# Patient Record
Sex: Female | Born: 1975 | Race: Black or African American | Hispanic: No | Marital: Single | Smoking: Former smoker
Health system: Southern US, Community
[De-identification: ages and names within clinical notes are randomized; demographics above are authoritative.]

## PROBLEM LIST (undated history)

## (undated) ENCOUNTER — Emergency Department (HOSPITAL_BASED_OUTPATIENT_CLINIC_OR_DEPARTMENT_OTHER): Payer: Self-pay

## (undated) DIAGNOSIS — D649 Anemia, unspecified: Secondary | ICD-10-CM

## (undated) DIAGNOSIS — K649 Unspecified hemorrhoids: Secondary | ICD-10-CM

## (undated) DIAGNOSIS — E559 Vitamin D deficiency, unspecified: Secondary | ICD-10-CM

## (undated) DIAGNOSIS — E669 Obesity, unspecified: Secondary | ICD-10-CM

## (undated) DIAGNOSIS — K219 Gastro-esophageal reflux disease without esophagitis: Secondary | ICD-10-CM

## (undated) DIAGNOSIS — M4302 Spondylolysis, cervical region: Secondary | ICD-10-CM

## (undated) DIAGNOSIS — M5030 Other cervical disc degeneration, unspecified cervical region: Secondary | ICD-10-CM

## (undated) DIAGNOSIS — T7840XA Allergy, unspecified, initial encounter: Secondary | ICD-10-CM

## (undated) DIAGNOSIS — L500 Allergic urticaria: Secondary | ICD-10-CM

## (undated) DIAGNOSIS — Q2112 Patent foramen ovale: Secondary | ICD-10-CM

## (undated) DIAGNOSIS — R7303 Prediabetes: Secondary | ICD-10-CM

## (undated) DIAGNOSIS — L309 Dermatitis, unspecified: Secondary | ICD-10-CM

## (undated) HISTORY — DX: Prediabetes: R73.03

## (undated) HISTORY — DX: Unspecified hemorrhoids: K64.9

## (undated) HISTORY — DX: Allergic urticaria: L50.0

## (undated) HISTORY — PX: OTHER SURGICAL HISTORY: SHX169

## (undated) HISTORY — DX: Spondylolysis, cervical region: M43.02

## (undated) HISTORY — DX: Dermatitis, unspecified: L30.9

## (undated) HISTORY — DX: Obesity, unspecified: E66.9

## (undated) HISTORY — DX: Allergy, unspecified, initial encounter: T78.40XA

## (undated) HISTORY — DX: Anemia, unspecified: D64.9

## (undated) HISTORY — DX: Vitamin D deficiency, unspecified: E55.9

## (undated) HISTORY — DX: Other cervical disc degeneration, unspecified cervical region: M50.30

---

## 2016-03-13 ENCOUNTER — Encounter: Payer: Self-pay | Admitting: Medical Oncology

## 2016-03-13 ENCOUNTER — Emergency Department
Admission: EM | Admit: 2016-03-13 | Discharge: 2016-03-13 | Disposition: A | Discharge status: Home / Self Care | Payer: BLUE CROSS/BLUE SHIELD | Attending: Emergency Medicine | Admitting: Emergency Medicine

## 2016-03-13 DIAGNOSIS — R112 Nausea with vomiting, unspecified: Secondary | ICD-10-CM | POA: Insufficient documentation

## 2016-03-13 LAB — COMPREHENSIVE METABOLIC PANEL
ALT: 13 U/L — ABNORMAL LOW (ref 14–54)
ANION GAP: 9 (ref 5–15)
AST: 24 U/L (ref 15–41)
Albumin: 4.3 g/dL (ref 3.5–5.0)
Alkaline Phosphatase: 58 U/L (ref 38–126)
BILIRUBIN TOTAL: 0.3 mg/dL (ref 0.3–1.2)
BUN: 9 mg/dL (ref 6–20)
CHLORIDE: 109 mmol/L (ref 101–111)
CO2: 23 mmol/L (ref 22–32)
Calcium: 8.9 mg/dL (ref 8.9–10.3)
Creatinine, Ser: 0.76 mg/dL (ref 0.44–1.00)
Glucose, Bld: 101 mg/dL — ABNORMAL HIGH (ref 65–99)
POTASSIUM: 4 mmol/L (ref 3.5–5.1)
Sodium: 141 mmol/L (ref 135–145)
TOTAL PROTEIN: 8.2 g/dL — AB (ref 6.5–8.1)

## 2016-03-13 LAB — CBC
HEMATOCRIT: 35.6 % (ref 35.0–47.0)
Hemoglobin: 11.2 g/dL — ABNORMAL LOW (ref 12.0–16.0)
MCH: 21 pg — ABNORMAL LOW (ref 26.0–34.0)
MCHC: 31.6 g/dL — ABNORMAL LOW (ref 32.0–36.0)
MCV: 66.5 fL — AB (ref 80.0–100.0)
Platelets: 325 10*3/uL (ref 150–440)
RBC: 5.36 MIL/uL — AB (ref 3.80–5.20)
RDW: 15.8 % — ABNORMAL HIGH (ref 11.5–14.5)
WBC: 9.6 10*3/uL (ref 3.6–11.0)

## 2016-03-13 LAB — URINALYSIS COMPLETE WITH MICROSCOPIC (ARMC ONLY)
BILIRUBIN URINE: NEGATIVE
GLUCOSE, UA: NEGATIVE mg/dL
Ketones, ur: NEGATIVE mg/dL
LEUKOCYTES UA: NEGATIVE
NITRITE: NEGATIVE
PH: 5 (ref 5.0–8.0)
Protein, ur: 100 mg/dL — AB
SPECIFIC GRAVITY, URINE: 1.024 (ref 1.005–1.030)

## 2016-03-13 LAB — LIPASE, BLOOD: LIPASE: 22 U/L (ref 11–51)

## 2016-03-13 LAB — POCT PREGNANCY, URINE: PREG TEST UR: NEGATIVE

## 2016-03-13 MED ORDER — ONDANSETRON HCL 4 MG/2ML IJ SOLN
4.0000 mg | Freq: Once | INTRAMUSCULAR | Status: AC
  Administered 2016-03-13: 4 mg via INTRAVENOUS
  Filled 2016-03-13: qty 2

## 2016-03-13 MED ORDER — ONDANSETRON 4 MG PO TBDP
ORAL_TABLET | ORAL | Status: AC
  Filled 2016-03-13: qty 1

## 2016-03-13 MED ORDER — SODIUM CHLORIDE 0.9 % IV BOLUS (SEPSIS)
1000.0000 mL | Freq: Once | INTRAVENOUS | Status: AC
  Administered 2016-03-13: 1000 mL via INTRAVENOUS

## 2016-03-13 MED ORDER — ONDANSETRON HCL 4 MG PO TABS: 4.0000 mg | ORAL_TABLET | Freq: Every day | ORAL | Status: DC | PRN

## 2016-03-13 MED ORDER — ONDANSETRON 4 MG PO TBDP
4.0000 mg | ORAL_TABLET | Freq: Once | ORAL | Status: AC | PRN
  Administered 2016-03-13: 4 mg via ORAL

## 2016-03-13 NOTE — ED Provider Notes (Signed)
[facility] Emergency Department Provider Note  ____________________________________________   I have reviewed the triage vital signs and the nursing notes.   HISTORY  Chief Complaint Nausea    HPI Renee Avery is a 40 y.o. female presents today complaining of feeling weak and woozy after the night of drinking last night. She had 5 or 6 or more drinks. Today she vomited several times and feels fine over. No abdominal pain no headache no trauma no fever no chills, hasn't had much to drink today because she's been vomiting.   History reviewed. No pertinent past medical history.  There are no active problems to display for this patient.   History reviewed. No pertinent past surgical history.  No current outpatient prescriptions on file.  Allergies Review of patient's allergies indicates no known allergies.  No family history on file.  Social History Social History  Substance Use Topics  . Smoking status: Never Smoker   . Smokeless tobacco: None  . Alcohol Use: Yes    Review of Systems Constitutional: No fever/chills Eyes: No visual changes. ENT: No sore throat. No stiff neck no neck pain Cardiovascular: Denies chest pain. Respiratory: Denies shortness of breath. Gastrointestinal:   Positive vomiting.  No diarrhea.  No constipation. Genitourinary: Negative for dysuria. Musculoskeletal: Negative lower extremity swelling Skin: Negative for rash. Neurological: Negative for headaches, focal weakness or numbness. 10-point ROS otherwise negative.  ____________________________________________   PHYSICAL EXAM:  VITAL SIGNS: ED Triage Vitals  Enc Vitals Group     BP 03/13/16 1653 99/63 mmHg     Pulse Rate 03/13/16 1653 87     Resp 03/13/16 1653 18     Temp 03/13/16 1653 99.2 F (37.3 C)     Temp Source 03/13/16 1653 Oral     SpO2 03/13/16 1653 96 %     Weight 03/13/16 1653 211 lb (95.709 kg)     Height 03/13/16 1653 5\' 3"  (1.6 m)     Head  Cir --      Peak Flow --      Pain Score 03/13/16 1654 8     Pain Loc --      Pain Edu? --      Excl. in [place]? --     Constitutional: Alert and oriented. Well appearing and in no acute distress. Eyes: Conjunctivae are normal. PERRL. EOMI. Head: Atraumatic. Nose: No congestion/rhinnorhea. Mouth/Throat: Mucous membranes are moist.  Oropharynx non-erythematous. Neck: No stridor.   Nontender with no meningismus Cardiovascular: Normal rate, regular rhythm. Grossly normal heart sounds.  Good peripheral circulation. Respiratory: Normal respiratory effort.  No retractions. Lungs CTAB. Abdominal: Soft and nontender. No distention. No guarding no rebound Back:  There is no focal tenderness or step off there is no midline tenderness there are no lesions noted. there is no CVA tenderness Musculoskeletal: No lower extremity tenderness. No joint effusions, no DVT signs strong distal pulses no edema Neurologic:  Normal speech and language. No gross focal neurologic deficits are appreciated.  Skin:  Skin is warm, dry and intact. No rash noted. Psychiatric: Mood and affect are normal. Speech and behavior are normal.  ____________________________________________   LABS (all labs ordered are listed, but only abnormal results are displayed)  Labs Reviewed  COMPREHENSIVE METABOLIC PANEL - Abnormal; Notable for the following:    Glucose, Bld 101 (*)    Total Protein 8.2 (*)    ALT 13 (*)    All other components within normal limits  CBC - Abnormal; Notable for the  following:    RBC 5.36 (*)    Hemoglobin 11.2 (*)    MCV 66.5 (*)    MCH 21.0 (*)    MCHC 31.6 (*)    RDW 15.8 (*)    All other components within normal limits  URINALYSIS COMPLETEWITH MICROSCOPIC ([facility] ONLY) - Abnormal; Notable for the following:    Color, Urine YELLOW (*)    APPearance TURBID (*)    Hgb urine dipstick 1+ (*)    Protein, ur 100 (*)    Bacteria, UA RARE (*)    Squamous Epithelial / LPF 0-5 (*)    All other  components within normal limits  LIPASE, BLOOD  POCT PREGNANCY, URINE  POC URINE PREG, ED   ____________________________________________  EKG  I personally interpreted any EKGs ordered by me or triage  ____________________________________________  RADIOLOGY  I reviewed any imaging ordered by me or triage that were performed during my shift and, if possible, patient and/or family made aware of any abnormal findings. ____________________________________________   PROCEDURES  Procedure(s) performed: None  Critical Care performed: None  ____________________________________________   INITIAL IMPRESSION / ASSESSMENT AND PLAN / ED COURSE  Pertinent labs & imaging results that were available during my care of the patient were reviewed by me and considered in my medical decision making (see chart for details).  Very well-appearing patient with symptoms of a alcohol hangover. ____________________________________________   FINAL CLINICAL IMPRESSION(S) / ED DIAGNOSES  Final diagnoses:  None      This chart was dictated using voice recognition software.  Despite best efforts to proofread,  errors can occur which can change meaning.     Schuyler Amor, MD 03/13/16 [id number]

## 2016-03-13 NOTE — Discharge Instructions (Signed)

## 2016-03-13 NOTE — ED Notes (Signed)
Pt reports she had drank last night which she usually doesn't do and today she feels, nauseated and shaky. Pt in NAD.

## 2016-03-13 NOTE — ED Notes (Signed)
NAD noted at time of D/C. Pt denies questions or concerns. Pt ambulatory to the lobby at this time.  

## 2016-08-05 ENCOUNTER — Ambulatory Visit (INDEPENDENT_AMBULATORY_CARE_PROVIDER_SITE_OTHER): Payer: BLUE CROSS/BLUE SHIELD | Admitting: Family Medicine

## 2016-08-05 VITALS — BP 122/68 | HR 62 | Temp 98.2°F | Resp 16 | Ht 63.0 in | Wt 219.0 lb

## 2016-08-05 DIAGNOSIS — Z800 Family history of malignant neoplasm of digestive organs: Secondary | ICD-10-CM

## 2016-08-05 DIAGNOSIS — K625 Hemorrhage of anus and rectum: Secondary | ICD-10-CM | POA: Diagnosis not present

## 2016-08-05 LAB — COMPREHENSIVE METABOLIC PANEL
ALT: 8 U/L (ref 6–29)
AST: 16 U/L (ref 10–30)
Albumin: 4 g/dL (ref 3.6–5.1)
Alkaline Phosphatase: 43 U/L (ref 33–115)
BILIRUBIN TOTAL: 0.3 mg/dL (ref 0.2–1.2)
BUN: 11 mg/dL (ref 7–25)
CHLORIDE: 105 mmol/L (ref 98–110)
CO2: 22 mmol/L (ref 20–31)
CREATININE: 0.82 mg/dL (ref 0.50–1.10)
Calcium: 8.7 mg/dL (ref 8.6–10.2)
GLUCOSE: 82 mg/dL (ref 65–99)
Potassium: 4.3 mmol/L (ref 3.5–5.3)
SODIUM: 138 mmol/L (ref 135–146)
Total Protein: 7.2 g/dL (ref 6.1–8.1)

## 2016-08-05 LAB — POCT CBC
GRANULOCYTE PERCENT: 51.7 % (ref 37–80)
HEMATOCRIT: 36.9 % — AB (ref 37.7–47.9)
HEMOGLOBIN: 12.2 g/dL (ref 12.2–16.2)
Lymph, poc: 3 (ref 0.6–3.4)
MCH: 21.9 pg — AB (ref 27–31.2)
MCHC: 33 g/dL (ref 31.8–35.4)
MCV: 66.2 fL — AB (ref 80–97)
MID (cbc): 0.3 (ref 0–0.9)
MPV: 7.7 fL (ref 0–99.8)
PLATELET COUNT, POC: 318 10*3/uL (ref 142–424)
POC GRANULOCYTE: 3.5 (ref 2–6.9)
POC LYMPH PERCENT: 43.8 %L (ref 10–50)
POC MID %: 4.5 %M (ref 0–12)
RBC: 5.58 M/uL — AB (ref 4.04–5.48)
RDW, POC: 15.5 %
WBC: 6.8 10*3/uL (ref 4.6–10.2)

## 2016-08-05 LAB — HEMOCCULT GUIAC POC 1CARD (OFFICE): FECAL OCCULT BLD: NEGATIVE

## 2016-08-05 LAB — SEDIMENTATION RATE: Sed Rate: 18 mm/hr (ref 0–20)

## 2016-08-05 NOTE — Patient Instructions (Signed)

## 2016-08-05 NOTE — Progress Notes (Signed)
Subjective:  By signing my name below, I, [name], attest that this documentation has been prepared under the direction and in the presence of Delman Cheadle, MD. Electronically Signed: [name], [place]. 08/05/2016 , 9:48 AM .  Patient was seen in Room 2 .   Patient ID: Renee Avery, female    DOB: 10-19-1975, 40 y.o.   MRN: [id number] Chief Complaint  Patient presents with  . Rectal Bleeding    Pt noticed blood in stool after BM yesterday    HPI Renee Avery is a 40 y.o. female who presents to [facility] complaining of blood in stool, twice this past week. Patient informs noticing darker blood in her stood yesterday and 3 days ago. She's had this issue sporadically in the past: first time in 2013, a few times in between since, and another time towards the end of last year. She didn't have it checked out initially because she didn't have insurance.   During the 2 episodes this past week, she noticed darker blood on the outside of her stool, and found some clotting when she was wiping, usually comes at the end. She mentions having alcohol the evening prior several times when this occurred, but did also have alcohol numerous times without this happening. Her bowels have been normal, about 1~2 times a day. She initially had some cramping and thought it was her period. She's been feeling nauseous during these episodes, but without vomiting. She denies diarrhea or constipation. She denies any changes with foods. She denies straining when passing a bowel movement. She denies sweats or unexpected weight loss. She denies urinary changes or symptoms. Her periods have always been irregular.   Her mother had colon cancer, diagnosed in her 55s. Patient has never seen a GI doctor for evaluation prior.   She also reports her entire abdomen is sore today, due to working out several days priors. She's also on birth control for uterine fibroids.   No past medical history on file. Prior to Admission medications    Medication Sig Start Date End Date Taking? Authorizing Provider  diphenhydrAMINE (BENADRYL) 25 mg capsule Take 25 mg by mouth every 6 (six) hours as needed.   Yes Historical Provider, MD  ferrous sulfate 325 (65 FE) MG EC tablet Take 325 mg by mouth 3 (three) times daily with meals.   Yes Historical Provider, MD  Multiple Vitamin (MULTIVITAMIN) capsule Take 1 capsule by mouth daily.   Yes Historical Provider, MD  norethindrone-ethinyl estradiol (MICROGESTIN,JUNEL,LOESTRIN) 1-20 MG-MCG tablet Take 1 tablet by mouth daily.   Yes Historical Provider, MD   No Known Allergies   Review of Systems  Constitutional: Negative for chills, diaphoresis, fatigue, fever and unexpected weight change.  Respiratory: Negative for cough.   Gastrointestinal: Positive for abdominal pain, blood in stool and nausea. Negative for constipation, diarrhea and vomiting.  Genitourinary: Negative for dysuria, frequency, hematuria and urgency.  Skin: Negative for rash and wound.  Neurological: Negative for dizziness, weakness and headaches.       Objective:   Physical Exam  Constitutional: She is oriented to person, place, and time. She appears well-developed and well-nourished. No distress.  HENT:  Head: Normocephalic and atraumatic.  Eyes: EOM are normal. Pupils are equal, round, and reactive to light.  Neck: Neck supple.  Cardiovascular: Normal rate, regular rhythm and normal heart sounds.   No murmur heard. Pulmonary/Chest: Effort normal and breath sounds normal. No respiratory distress. She has no wheezes.  Abdominal: Soft. Bowel sounds are normal. There is  no hepatosplenomegaly. There is generalized tenderness. There is no rebound, no guarding and no CVA tenderness.  Genitourinary:  Genitourinary Comments: Rectal: 2 small external hemorrhoids, non-inflamed, at approximately 10 and 12 o'clock, increased anal tone, no masses; some firm stool in vault, possible grade 1 mild internal hemorrhoids    Musculoskeletal: Normal range of motion.  Neurological: She is alert and oriented to person, place, and time.  Skin: Skin is warm and dry.  Psychiatric: She has a normal mood and affect. Her behavior is normal.  Nursing note and vitals reviewed.   BP 122/68   Pulse 62   Temp 98.2 F (36.8 C) (Oral)   Resp 16   Ht 5\' 3"  (1.6 m)   Wt 219 lb (99.3 kg)   LMP 08/05/2016   SpO2 98%   BMI 38.79 kg/m    Results for orders placed or performed in visit on 08/05/16  POCT occult blood stool  Result Value Ref Range   Fecal Occult Blood, POC Negative Negative   Card #1 Date 08/05/2016    Card #2 Fecal Occult Blod, POC     Card #2 Date     Card #3 Fecal Occult Blood, POC     Card #3 Date    POCT CBC  Result Value Ref Range   WBC 6.8 4.6 - 10.2 K/uL   Lymph, poc 3.0 0.6 - 3.4   POC LYMPH PERCENT 43.8 10 - 50 %L   MID (cbc) 0.3 0 - 0.9   POC MID % 4.5 0 - 12 %M   POC Granulocyte 3.5 2 - 6.9   Granulocyte percent 51.7 37 - 80 %G   RBC 5.58 (A) 4.04 - 5.48 M/uL   Hemoglobin 12.2 12.2 - 16.2 g/dL   HCT, POC 36.9 (A) 37.7 - 47.9 %   MCV 66.2 (A) 80 - 97 fL   MCH, POC 21.9 (A) 27 - 31.2 pg   MCHC 33.0 31.8 - 35.4 g/dL   RDW, POC 15.5 %   Platelet Count, POC 318 142 - 424 K/uL   MPV 7.7 0 - 99.8 fL        Assessment & Plan:   1. Rectal bleeding   2. Family history of colon cancer in mother   Unknown etiology but has already resolved. - I suspect internal hemorrhoids but today's exam overall seems fairly normal. It is reassuring that her sxs have been so isolated and self-limited but pt clearly needs GI eval due to + colon cancer in mother and unknown etiology of this isolated sporadic self-limited episodes of lower GI bleed. In the interim, do home [place] cards and start daily fiber supp.  Orders Placed This Encounter  Procedures  . Comprehensive metabolic panel  . Sedimentation Rate  . C-reactive protein  . Ambulatory referral to Gastroenterology    Referral Priority:    Routine    Referral Type:   Consultation    Referral Reason:   Specialty Services Required    Number of Visits Requested:   1  . POCT occult blood stool  . POCT CBC  . POC Hemoccult Bld/Stl (3-Cd Home Screen)    Standing Status:   Future    Standing Expiration Date:   08/05/2017    Meds ordered this encounter  Medications  . norethindrone-ethinyl estradiol (MICROGESTIN,JUNEL,LOESTRIN) 1-20 MG-MCG tablet    Sig: Take 1 tablet by mouth daily.  . ferrous sulfate 325 (65 FE) MG EC tablet    Sig: Take 325 mg by mouth  3 (three) times daily with meals.  . Multiple Vitamin (MULTIVITAMIN) capsule    Sig: Take 1 capsule by mouth daily.  . diphenhydrAMINE (BENADRYL) 25 mg capsule    Sig: Take 25 mg by mouth every 6 (six) hours as needed.    I personally performed the services described in this documentation, which was scribed in my presence. The recorded information has been reviewed and considered, and addended by me as needed.   Delman Cheadle, M.D.  [facility] [address] [place], [place] [address] [phone] phone [phone] fax  08/05/16 4:27 PM

## 2016-08-06 LAB — C-REACTIVE PROTEIN: CRP: 13.6 mg/L — AB (ref <8.0)

## 2016-08-15 ENCOUNTER — Encounter: Payer: Self-pay | Admitting: Family Medicine

## 2016-08-22 ENCOUNTER — Encounter: Payer: Self-pay | Admitting: Gastroenterology

## 2016-08-31 ENCOUNTER — Ambulatory Visit (INDEPENDENT_AMBULATORY_CARE_PROVIDER_SITE_OTHER): Payer: BC Managed Care – PPO | Admitting: Gastroenterology

## 2016-08-31 ENCOUNTER — Encounter: Payer: Self-pay | Admitting: Gastroenterology

## 2016-08-31 VITALS — BP 100/72 | HR 82 | Ht 62.5 in | Wt 219.0 lb

## 2016-08-31 DIAGNOSIS — K625 Hemorrhage of anus and rectum: Secondary | ICD-10-CM | POA: Diagnosis not present

## 2016-08-31 DIAGNOSIS — Z800 Family history of malignant neoplasm of digestive organs: Secondary | ICD-10-CM

## 2016-08-31 NOTE — Progress Notes (Signed)
[facility] Consult Note:  History: Renee Avery 08/31/2016  Referring physician: No PCP Per Patient  Reason for consult/chief complaint: Rectal Bleeding (off/on x several years, several episodes lately) and Abdominal Cramping (prior to bowel movements)   Subjective  HPI:  2 episodes rectal bleeding last month, none since. Had an episode in June associated with crampy lower abd pain before. No constipation, but does often sit long on the toilet because sending emails or reading.  Lately trying to increase fiber and water and exercise. Sits/drives a lot b/c lives in [place] and works in [place]. Denies UGI symptoms. Had an episode of rectal bleeding  in 2013 - did not seek medical attention.  ROS:  Review of Systems  Constitutional: Negative for appetite change and unexpected weight change.  HENT: Negative for mouth sores and voice change.   Eyes: Negative for pain and redness.  Respiratory: Negative for cough and shortness of breath.   Cardiovascular: Negative for chest pain and palpitations.  Genitourinary: Negative for dysuria and hematuria.  Musculoskeletal: Negative for arthralgias and myalgias.  Skin: Negative for pallor and rash.  Neurological: Negative for weakness and headaches.  Hematological: Negative for adenopathy.     Past Medical History: Past Medical History:  Diagnosis Date  . Anemia   . Hemorrhoid   . Obesity      Past Surgical History: Past Surgical History:  Procedure Laterality Date  . uterine biopsy     benign     Family History: Family History  Problem Relation Age of Onset  . Colon cancer Mother     in her 31s  . Colon polyps Mother   . Heart attack Father    Mother still alive age 76 No additional known family members with CRC Social History: Social History   Social History  . Marital status: Single    Spouse name: N/A  . Number of children: 0  . Years of education: N/A   Occupational History  .  [employer]    Social History Main Topics  . Smoking status: Former Smoker    Years: 2.00    Types: Cigarettes  . Smokeless tobacco: Never Used  . Alcohol use Yes     Comment: social  . Drug use: No  . Sexual activity: Not Asked   Other Topics Concern  . None   Social History Narrative  . None  high school teacher  Allergies: No Known Allergies  Outpatient Meds: Current Outpatient Prescriptions  Medication Sig Dispense Refill  . diphenhydrAMINE (BENADRYL) 25 mg capsule Take 25 mg by mouth every 6 (six) hours as needed.    . ferrous sulfate 325 (65 FE) MG EC tablet Take 325 mg by mouth daily.     . Multiple Vitamin (MULTIVITAMIN) capsule Take 1 capsule by mouth daily.    . norethindrone-ethinyl estradiol (MICROGESTIN,JUNEL,LOESTRIN) 1-20 MG-MCG tablet Take 1 tablet by mouth daily.     No current facility-administered medications for this visit.       ___________________________________________________________________ Objective   Exam:  BP 100/72   Pulse 82   Ht 5' 2.5" (1.588 m)   Wt 219 lb (99.3 kg)   LMP 08/05/2016   BMI 39.42 kg/m    General: this is a(n) well-appearing woman   Eyes: sclera anicteric, no redness  ENT: oral mucosa moist without lesions, no cervical or supraclavicular lymphadenopathy, good dentition  CV: RRR without murmur, S1/S2, no JVD, no peripheral edema  Resp: clear to auscultation bilaterally, normal RR and effort noted  GI: soft, obese, no tenderness, with active bowel sounds. No guarding or palpable organomegaly noted.  Skin; warm and dry, no rash or jaundice noted  Neuro: awake, alert and oriented x 3. Normal gross motor function and fluent speech  Labs:  CBC Latest Ref Rng & Units 08/05/2016 03/13/2016  WBC 4.6 - 10.2 K/uL 6.8 9.6  Hemoglobin 12.2 - 16.2 g/dL 12.2 11.2(L)  Hematocrit 37.7 - 47.9 % 36.9(A) 35.6  Platelets 150 - 440 K/uL - 325     Assessment: Encounter Diagnoses  Name Primary?  . Rectal  bleeding Yes  . Family history of colon cancer in mother     Sounds likely hemorrhoidal in nature  Plan:  Colonoscopy.  She is agreeable.  Thank you for the courtesy of this consult.  Please call me with any questions or concerns.  Nelida Meuse III  CC: No PCP Per Patient

## 2016-08-31 NOTE — Patient Instructions (Signed)
If you are age 40 or older, your body mass index should be between 23-30. Your Body mass index is 39.42 kg/m. If this is out of the aforementioned range listed, please consider follow up with your Primary Care Provider.  If you are age 30 or younger, your body mass index should be between 19-25. Your Body mass index is 39.42 kg/m. If this is out of the aformentioned range listed, please consider follow up with your Primary Care Provider.   It has been recommended to you by your physician that you have a(n) Colonoscopy completed. Per your request, we did not schedule the procedure today. Please contact our office at [phone] when you pick a scheduling date.  Thank you for choosing [facility]  Dr Wilfrid Lund III

## 2016-09-07 ENCOUNTER — Encounter: Payer: Self-pay | Admitting: Gastroenterology

## 2016-09-21 ENCOUNTER — Ambulatory Visit (AMBULATORY_SURGERY_CENTER): Payer: Self-pay

## 2016-09-21 VITALS — Ht 61.75 in | Wt 226.0 lb

## 2016-09-21 DIAGNOSIS — K625 Hemorrhage of anus and rectum: Secondary | ICD-10-CM

## 2016-09-21 MED ORDER — SUPREP BOWEL PREP KIT 17.5-3.13-1.6 GM/177ML PO SOLN: 1.0000 | Freq: Once | ORAL | 0 refills | Status: AC

## 2016-09-21 NOTE — Progress Notes (Signed)
No allergies to eggs or soy No past exposure with anesthesia No diet meds No oxygen  registerd for emmi

## 2016-09-29 ENCOUNTER — Ambulatory Visit (AMBULATORY_SURGERY_CENTER): Payer: BC Managed Care – PPO | Admitting: Gastroenterology

## 2016-09-29 ENCOUNTER — Encounter: Payer: Self-pay | Admitting: Gastroenterology

## 2016-09-29 VITALS — BP 127/72 | HR 66 | Temp 98.2°F | Resp 14 | Ht 61.75 in | Wt 226.0 lb

## 2016-09-29 DIAGNOSIS — Z800 Family history of malignant neoplasm of digestive organs: Secondary | ICD-10-CM | POA: Diagnosis not present

## 2016-09-29 DIAGNOSIS — K625 Hemorrhage of anus and rectum: Secondary | ICD-10-CM

## 2016-09-29 DIAGNOSIS — K635 Polyp of colon: Secondary | ICD-10-CM | POA: Diagnosis not present

## 2016-09-29 DIAGNOSIS — D122 Benign neoplasm of ascending colon: Secondary | ICD-10-CM

## 2016-09-29 MED ORDER — SODIUM CHLORIDE 0.9 % IV SOLN: 500.0000 mL | INTRAVENOUS | Status: DC

## 2016-09-29 NOTE — Progress Notes (Signed)
Called to room to assist during endoscopic procedure.  Patient ID and intended procedure confirmed with present staff. Received instructions for my participation in the procedure from the performing physician.  

## 2016-09-29 NOTE — Progress Notes (Signed)
A/ox3 pleased with MAC, report to Jane RN 

## 2016-09-29 NOTE — Op Note (Signed)
[place] Patient Name: Renee Avery Procedure Date: 09/29/2016 4:04 PM MRN: [id number] Endoscopist: Mallie Mussel L. Loletha Carrow , MD Age: 40 Referring MD:  Date of Birth: 11/21/1975 Gender: Female Account #: [id number] Procedure:                Colonoscopy Indications:              Screening in patient at increased risk: Colorectal                            cancer in mother 22 or older, This is the patient's                            first colonoscopy Medicines:                Monitored Anesthesia Care Procedure:                Pre-Anesthesia Assessment:                           - Prior to the procedure, a History and Physical                            was performed, and patient medications and                            allergies were reviewed. The patient's tolerance of                            previous anesthesia was also reviewed. The risks                            and benefits of the procedure and the sedation                            options and risks were discussed with the patient.                            All questions were answered, and informed consent                            was obtained. Prior Anticoagulants: The patient has                            taken no previous anticoagulant or antiplatelet                            agents. ASA Grade Assessment: III - A patient with                            severe systemic disease. After reviewing the risks                            and benefits, the patient was deemed in  satisfactory condition to undergo the procedure.                           After obtaining informed consent, the colonoscope                            was passed under direct vision. Throughout the                            procedure, the patient's blood pressure, pulse, and                            oxygen saturations were monitored continuously. The                            Model CF-HQ190L [id number]) scope  was introduced                            through the anus and advanced to the the cecum,                            identified by appendiceal orifice and ileocecal                            valve. The colonoscopy was performed without                            difficulty. The patient tolerated the procedure                            well. The quality of the bowel preparation was                            excellent. The ileocecal valve, appendiceal                            orifice, and rectum were photographed. The quality                            of the bowel preparation was evaluated using the                            BBPS [facility] Bowel Preparation Scale) with scores                            of: Right Colon = 3, Transverse Colon = 3 and Left                            Colon = 3 (entire mucosa seen well with no residual                            staining, small fragments of stool or opaque  liquid). The total BBPS score equals 9. The bowel                            preparation used was SUPREP. Scope In: 4:08:21 PM Scope Out: 4:19:47 PM Scope Withdrawal Time: 0 hours 9 minutes 20 seconds  Total Procedure Duration: 0 hours 11 minutes 26 seconds  Findings:                 The perianal and digital rectal examinations were                            normal.                           A 2 mm polyp was found in the proximal ascending                            colon. The polyp was sessile. The polyp was removed                            with a cold biopsy forceps. Resection and retrieval                            were complete.                           The exam was otherwise without abnormality on                            direct and retroflexion views. Complications:            No immediate complications. Estimated Blood Loss:     Estimated blood loss: none. Impression:               - One 2 mm polyp in the proximal ascending colon,                             removed with a cold biopsy forceps. Resected and                            retrieved.                           - The examination was otherwise normal on direct                            and retroflexion views.                           The patient has had a few episodes of benign anal                            bleeding related to constipation. Recommendation:           - Patient has a contact number available for  emergencies. The signs and symptoms of potential                            delayed complications were discussed with the                            patient. Return to normal activities tomorrow.                            Written discharge instructions were provided to the                            patient.                           - Resume previous diet.                           - Continue present medications.                           - Await pathology results.                           - Repeat colonoscopy in 5 years for surveillance. Nakia Koble L. Loletha Carrow, MD 09/29/2016 4:23:36 PM This report has been signed electronically.

## 2016-09-29 NOTE — Patient Instructions (Signed)
Impression/Recommendations:  [place] handout given to patient.  Repeat colonoscopy in 5 years for surveillance.  YOU HAD AN ENDOSCOPIC PROCEDURE TODAY AT [facility]:   Refer to the procedure report that was given to you for any specific questions about what was found during the examination.  If the procedure report does not answer your questions, please call your gastroenterologist to clarify.  If you requested that your care partner not be given the details of your procedure findings, then the procedure report has been included in a sealed envelope for you to review at your convenience later.  YOU SHOULD EXPECT: Some feelings of bloating in the abdomen. Passage of more gas than usual.  Walking can help get rid of the air that was put into your GI tract during the procedure and reduce the bloating. If you had a lower endoscopy (such as a colonoscopy or flexible sigmoidoscopy) you may notice spotting of blood in your stool or on the toilet paper. If you underwent a bowel prep for your procedure, you may not have a normal bowel movement for a few days.  Please Note:  You might notice some irritation and congestion in your nose or some drainage.  This is from the oxygen used during your procedure.  There is no need for concern and it should clear up in a day or so.  SYMPTOMS TO REPORT IMMEDIATELY:   Following lower endoscopy (colonoscopy or flexible sigmoidoscopy):  Excessive amounts of blood in the stool  Significant tenderness or worsening of abdominal pains  Swelling of the abdomen that is new, acute  Fever of 100F or higher For urgent or emergent issues, a gastroenterologist can be reached at any hour by calling [phone].   DIET:  We do recommend a small meal at first, but then you may proceed to your regular diet.  Drink plenty of fluids but you should avoid alcoholic beverages for 24 hours.  ACTIVITY:  You should plan to take it easy for the rest of today and you  should NOT DRIVE or use heavy machinery until tomorrow (because of the sedation medicines used during the test).    FOLLOW UP: Our staff will call the number listed on your records the next business day following your procedure to check on you and address any questions or concerns that you may have regarding the information given to you following your procedure. If we do not reach you, we will leave a message.  However, if you are feeling well and you are not experiencing any problems, there is no need to return our call.  We will assume that you have returned to your regular daily activities without incident.  If any biopsies were taken you will be contacted by phone or by letter within the next 1-3 weeks.  Please call us at [phone] if you have not heard about the biopsies in 3 weeks.    SIGNATURES/CONFIDENTIALITY: You and/or your care partner have signed paperwork which will be entered into your electronic medical record.  These signatures attest to the fact that that the information above on your After Visit Summary has been reviewed and is understood.  Full responsibility of the confidentiality of this discharge information lies with you and/or your care-partner.

## 2016-09-30 ENCOUNTER — Telehealth: Payer: Self-pay

## 2016-09-30 NOTE — Telephone Encounter (Signed)
  Follow up Call-  Call back number 09/29/2016  Post procedure Call Back phone  # [phone]  Permission to leave phone message Yes     Patient questions:  Do you have a fever, pain , or abdominal swelling? No. Pain Score  0 *  Have you tolerated food without any problems? Yes.    Have you been able to return to your normal activities? Yes.    Do you have any questions about your discharge instructions: Diet   No. Medications  No. Follow up visit  No.  Do you have questions or concerns about your Care? No.  Actions: * If pain score is 4 or above: No action needed, pain <4.

## 2016-10-08 ENCOUNTER — Encounter: Payer: Self-pay | Admitting: Gastroenterology

## 2016-11-07 ENCOUNTER — Ambulatory Visit (INDEPENDENT_AMBULATORY_CARE_PROVIDER_SITE_OTHER): Payer: BC Managed Care – PPO | Admitting: Allergy and Immunology

## 2016-11-07 ENCOUNTER — Encounter: Payer: Self-pay | Admitting: Allergy and Immunology

## 2016-11-07 VITALS — BP 116/70 | HR 68 | Temp 98.3°F | Ht 61.25 in | Wt 224.8 lb

## 2016-11-07 DIAGNOSIS — T7840XA Allergy, unspecified, initial encounter: Secondary | ICD-10-CM | POA: Diagnosis not present

## 2016-11-07 DIAGNOSIS — L500 Allergic urticaria: Secondary | ICD-10-CM

## 2016-11-07 LAB — COMPREHENSIVE METABOLIC PANEL
ALT: 8 U/L (ref 6–29)
AST: 12 U/L (ref 10–30)
Albumin: 3.7 g/dL (ref 3.6–5.1)
Alkaline Phosphatase: 43 U/L (ref 33–115)
BUN: 8 mg/dL (ref 7–25)
CO2: 24 mmol/L (ref 20–31)
Calcium: 8.7 mg/dL (ref 8.6–10.2)
Chloride: 104 mmol/L (ref 98–110)
Creat: 0.67 mg/dL (ref 0.50–1.10)
Glucose, Bld: 79 mg/dL (ref 65–99)
Potassium: 4 mmol/L (ref 3.5–5.3)
Sodium: 137 mmol/L (ref 135–146)
Total Bilirubin: 0.2 mg/dL (ref 0.2–1.2)
Total Protein: 7.1 g/dL (ref 6.1–8.1)

## 2016-11-07 LAB — CBC WITH DIFFERENTIAL/PLATELET
Basophils Absolute: 73 cells/uL (ref 0–200)
Basophils Relative: 1 %
Eosinophils Absolute: 219 cells/uL (ref 15–500)
Eosinophils Relative: 3 %
HCT: 35.5 % (ref 35.0–45.0)
Hemoglobin: 10.8 g/dL — ABNORMAL LOW (ref 11.7–15.5)
Lymphocytes Relative: 37 %
Lymphs Abs: 2701 cells/uL (ref 850–3900)
MCH: 20.7 pg — ABNORMAL LOW (ref 27.0–33.0)
MCHC: 30.4 g/dL — ABNORMAL LOW (ref 32.0–36.0)
MCV: 68.1 fL — ABNORMAL LOW (ref 80.0–100.0)
MPV: 9.1 fL (ref 7.5–12.5)
Monocytes Absolute: 292 cells/uL (ref 200–950)
Monocytes Relative: 4 %
Neutro Abs: 4015 cells/uL (ref 1500–7800)
Neutrophils Relative %: 55 %
Platelets: 337 10*3/uL (ref 140–400)
RBC: 5.21 MIL/uL — ABNORMAL HIGH (ref 3.80–5.10)
RDW: 15.9 % — ABNORMAL HIGH (ref 11.0–15.0)
WBC: 7.3 10*3/uL (ref 3.8–10.8)

## 2016-11-07 MED ORDER — EPINEPHRINE 0.3 MG/0.3ML IJ SOAJ: INTRAMUSCULAR | 1 refills | Status: DC

## 2016-11-07 MED ORDER — LEVOCETIRIZINE DIHYDROCHLORIDE 5 MG PO TABS: 5.0000 mg | ORAL_TABLET | Freq: Every evening | ORAL | 5 refills | Status: DC

## 2016-11-07 NOTE — Progress Notes (Signed)
New Patient Note  RE: Milanni Kisel MRN: [id number] DOB: 04/04/1976 Date of Office Visit: 11/07/2016  Referring provider: Medicine, [place]* Primary care provider: [facility]  Chief Complaint: Urticaria (with exercise since July 2015; wrist elbow chest and face) and Insect Bite (?bug bite hives, trouble breathing, swelling July 2015)   History of present illness: Renee Avery is a 41 y.o. female seen today in consultation requested by [place]. Since July 2015, Renee Avery has experienced recurrent episodes of hives. Typical distribution includes the face, chest, back, arms and legs.  The lesions are described as erythematous, raised, and pruritic.  Individual hives last less than 24 hours without leaving residual pigmentation or bruising. She denies concomitant angioedema, cardiopulmonary symptoms and GI symptoms. She has not experienced unexpected weight loss, recurrent fevers or drenching night sweats. The symptoms do not seem to correlate with NSAIDs use or emotional stress. She takes diphenhydramine with rapid symptom relief. On average the hives occur one time per month, typically while exercising if she has not exercised for a few weeks. She is able to exercise without developing hives if she is consistent with her workouts. Onset occurred in July 2015 after having been bitten or stung by an unidentified insect. There was no retained stinger or residual pustule. The area of the bite remained inflamed for approximately 2 weeks. She experienced dyspnea and chest tightness immediately after the bite requiring evaluation and treatment in the local emergency department. She reports that since that time she was been bitten by a "small black or brown" ant and experienced dyspnea and chest tightness. She states that the bite was pruritic but not painful. On this occasion she took diphenhydramine with resolution of symptoms.    Assessment and  plan: Recurrent urticaria Unclear etiology. Skin tests to select food allergens were negative today. NSAIDs and emotional stress commonly exacerbate urticaria but are not the underlying etiology in this case. Physical urticarias are negative by history (i.e. pressure-induced, temperature, vibration, solar, etc.). There are no concomitant symptoms concerning for anaphylaxis or constitutional symptoms worrisome for an underlying malignancy. We will rule out other potential etiologies with labs. For symptom relief, patient is to take oral antihistamines as directed.  The following labs have been ordered: FCeRI antibody, TSH, anti-thyroglobulin antibody, thyroid peroxidase antibody, tryptase, CBC, CMP, and galactose-alpha-1,3-galactose IgE level.  The patient will be called with further recommendations after lab results have returned.  A prescription has been provided for levocetirizine, 5mg  daily as needed.  A journal is to be kept recording any foods eaten, beverages consumed, medications taken within a 6 hour period prior to the onset of symptoms, as well as record activities being performed, and environmental conditions. For any symptoms concerning for anaphylaxis, 911 is to be called immediately.  Allergic reaction The patient's history suggests hypersensitivity to insect bites/sting.  We will check serum specific IgE against hymenoptera venom panel and fire ant.  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  Unfortunately, if this is the case, there are no means of evaluating her treating other than avoidance and have access to epinephrine autoinjectors.  Laboratory form has been provided for serum specific IgE against hymenoptera venom panel and fire ant.  Continue careful avoidance of insects and have access to epinephrine autoinjectors in case of sting/bites followed by systemic symptoms.  A prescription has been provided for  epinephrine 0.3 mg autoinjector 2 pack along with instructions for its proper  administration.   Meds ordered this encounter  Medications  . levocetirizine (XYZAL) 5 MG tablet    Sig: Take 1 tablet (5 mg total) by mouth every evening.    Dispense:  30 tablet    Refill:  5  . EPINEPHrine 0.3 mg/0.3 mL IJ SOAJ injection    Sig: Use as directed for severe allergic reaction    Dispense:  2 Device    Refill:  1    Dispense Mylan generic or brand only    Diagnostics: Environmental skin testing:  Negative despite a positive histamine control. Food allergen skin testing:  Negative despite a positive histamine control.    Physical examination: Blood pressure 116/70, pulse 68, temperature 98.3 F (36.8 C), temperature source Oral, height 5' 1.25" (1.556 m), weight 224 lb 12.8 oz (102 kg).  General: Alert, interactive, in no acute distress. HEENT: TMs pearly gray, turbinates minimally edematous without discharge, post-pharynx unremarkable. Neck: Supple without lymphadenopathy. Lungs: Clear to auscultation without wheezing, rhonchi or rales. CV: Normal S1, S2 without murmurs. Abdomen: Nondistended, nontender. Skin: Warm and dry, without lesions or rashes. Extremities:  No clubbing, cyanosis or edema. Neuro:   Grossly intact.  Review of systems:  Review of systems negative except as noted in HPI / PMHx or noted below: Review of Systems  Constitutional: Negative.   HENT: Negative.   Eyes: Negative.   Respiratory: Negative.   Cardiovascular: Negative.   Gastrointestinal: Negative.   Genitourinary: Negative.   Musculoskeletal: Negative.   Skin: Negative.   Neurological: Negative.   Endo/Heme/Allergies: Negative.   Psychiatric/Behavioral: Negative.     Past medical history:  Past Medical History:  Diagnosis Date  . Allergy    bug bite; hives  . Anemia   . Eczema   . Hemorrhoid   . Obesity     Past surgical history:  Past Surgical History:  Procedure Laterality Date  .  uterine biopsy     benign    Family history: Family History  Problem Relation Age of Onset  . Colon cancer Mother     in her 41s  . Colon polyps Mother   . Heart attack Father   . Asthma Sister   . Eczema Sister   . Food Allergy Sister     shellfish, chocolate  . Eczema Sister   . Allergic rhinitis Neg Hx   . Angioedema Neg Hx     Social history: Social History   Social History  . Marital status: Single    Spouse name: N/A  . Number of children: 0  . Years of education: N/A   Occupational History  . [employer]    Social History Main Topics  . Smoking status: Former Smoker    Years: 2.00    Types: Cigarettes  . Smokeless tobacco: Never Used  . Alcohol use Yes     Comment: social  . Drug use: No  . Sexual activity: Not on file   Other Topics Concern  . Not on file   Social History Narrative  . No narrative on file   Environmental History: The patient lives in an apartment with carpeting throughout and central air/heat.  She is a nonsmoker without pets.  Allergies as of 11/07/2016      Reactions   Other Anaphylaxis   Bug bites      Medication List       Accurate as of 11/07/16  9:46 AM. Always use your most recent med list.  diphenhydrAMINE 25 mg capsule Commonly known as:  BENADRYL Take 25 mg by mouth every 6 (six) hours as needed.   EPINEPHrine 0.3 mg/0.3 mL Soaj injection Commonly known as:  EPI-PEN Use as directed for severe allergic reaction   ferrous sulfate 325 (65 FE) MG EC tablet Take 325 mg by mouth daily.   levocetirizine 5 MG tablet Commonly known as:  XYZAL Take 1 tablet (5 mg total) by mouth every evening.   multivitamin capsule Take 1 capsule by mouth daily.   mupirocin ointment 2 % Commonly known as:  BACTROBAN   norethindrone-ethinyl estradiol 1-20 MG-MCG tablet Commonly known as:  MICROGESTIN,JUNEL,LOESTRIN Take 1 tablet by mouth daily.   triamcinolone ointment 0.1 % Commonly known as:  KENALOG        Known medication allergies: Allergies  Allergen Reactions  . Other Anaphylaxis    Bug bites    I appreciate the opportunity to take part in Renee Avery's care. Please do not hesitate to contact me with questions.  Sincerely,   R. Edgar Frisk, MD

## 2016-11-07 NOTE — Assessment & Plan Note (Addendum)
Unclear etiology. Skin tests to select food allergens were negative today. NSAIDs and emotional stress commonly exacerbate urticaria but are not the underlying etiology in this case. Physical urticarias are negative by history (i.e. pressure-induced, temperature, vibration, solar, etc.). There are no concomitant symptoms concerning for anaphylaxis or constitutional symptoms worrisome for an underlying malignancy. We will rule out other potential etiologies with labs. For symptom relief, patient is to take oral antihistamines as directed.  The following labs have been ordered: FCeRI antibody, TSH, anti-thyroglobulin antibody, thyroid peroxidase antibody, tryptase, CBC, CMP, and galactose-alpha-1,3-galactose IgE level.  The patient will be called with further recommendations after lab results have returned.  A prescription has been provided for levocetirizine, 5mg  daily as needed.  A journal is to be kept recording any foods eaten, beverages consumed, medications taken within a 6 hour period prior to the onset of symptoms, as well as record activities being performed, and environmental conditions. For any symptoms concerning for anaphylaxis, 911 is to be called immediately.

## 2016-11-07 NOTE — Assessment & Plan Note (Signed)
The patient's history suggests hypersensitivity to insect bites/sting.  We will check serum specific IgE against hymenoptera venom panel and fire ant.  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  Unfortunately, if this is the case, there are no means of evaluating her treating other than avoidance and have access to epinephrine autoinjectors.  Laboratory form has been provided for serum specific IgE against hymenoptera venom panel and fire ant.  Continue careful avoidance of insects and have access to epinephrine autoinjectors in case of sting/bites followed by systemic symptoms.  A prescription has been provided for epinephrine 0.3 mg autoinjector 2 pack along with instructions for its proper administration.

## 2016-11-07 NOTE — Patient Instructions (Addendum)
Recurrent urticaria Unclear etiology. Skin tests to select food allergens were negative today. NSAIDs and emotional stress commonly exacerbate urticaria but are not the underlying etiology in this case. Physical urticarias are negative by history (i.e. pressure-induced, temperature, vibration, solar, etc.). There are no concomitant symptoms concerning for anaphylaxis or constitutional symptoms worrisome for an underlying malignancy. We will rule out other potential etiologies with labs. For symptom relief, patient is to take oral antihistamines as directed.  The following labs have been ordered: FCeRI antibody, TSH, anti-thyroglobulin antibody, thyroid peroxidase antibody, tryptase, CBC, CMP, and galactose-alpha-1,3-galactose IgE level.  The patient will be called with further recommendations after lab results have returned.  A prescription has been provided for levocetirizine, 5mg  daily as needed.  A journal is to be kept recording any foods eaten, beverages consumed, medications taken within a 6 hour period prior to the onset of symptoms, as well as record activities being performed, and environmental conditions. For any symptoms concerning for anaphylaxis, 911 is to be called immediately.  Allergic reaction The patient's history suggests hypersensitivity to insect bites/sting.  We will check serum specific IgE against hymenoptera venom panel and fire ant.  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  Unfortunately, if this is the case, there are no means of evaluating her treating other than avoidance and have access to epinephrine autoinjectors.  Laboratory form has been provided for serum specific IgE against hymenoptera venom panel and fire ant.  Continue careful avoidance of insects and have access to epinephrine autoinjectors in case of sting/bites followed by systemic symptoms.  A prescription has been provided for epinephrine 0.3  mg autoinjector 2 pack along with instructions for its proper administration.   When lab results have returned the patient will be called with further recommendations and follow up instructions.

## 2016-11-08 LAB — ALLERGEN HYMENOPTERA PANEL
Honey Bee IgE: 0.45 kU/L — ABNORMAL HIGH
Paper Wasp IgE: 0.59 kU/L — ABNORMAL HIGH
White Hornet IgE: 0.1 kU/L — ABNORMAL HIGH
Yellow Hornet IgE: <0.1 kU/L
Yellow Jacket IgE: 0.38 kU/L — ABNORMAL HIGH

## 2016-11-08 LAB — TRYPTASE: Tryptase: 3.4 ug/L (ref <11)

## 2016-11-08 LAB — ALLERGEN FIRE ANT: Fire Ant IgE: 0.11 kU/L — ABNORMAL HIGH

## 2016-11-09 LAB — ALPHA-GAL PANEL
Beef IgE: <0.1 kU/L (ref <0.35)
Class: 0
Class: 0
Class: 0
Galactose-alpha-1,3-galactose IgE*: <0.1 kU/L (ref <0.35)
Lamb/Mutton IgE: <0.1 kU/L (ref <0.35)
Pork IgE: <0.1 kU/L (ref <0.35)

## 2016-11-14 LAB — CP CHRONIC URTICARIA INDEX PANEL
Histamine Release: <16 % (ref <16)
TSH: 2.08 mIU/L
Thyroglobulin Ab: 6 IU/mL — ABNORMAL HIGH (ref <2)
Thyroperoxidase Ab SerPl-aCnc: 112 IU/mL — ABNORMAL HIGH (ref <9)

## 2016-12-02 ENCOUNTER — Other Ambulatory Visit: Payer: Self-pay

## 2016-12-08 ENCOUNTER — Encounter: Payer: Self-pay | Admitting: *Deleted

## 2017-10-23 ENCOUNTER — Encounter (HOSPITAL_COMMUNITY): Payer: Self-pay | Admitting: Emergency Medicine

## 2017-10-23 ENCOUNTER — Ambulatory Visit (HOSPITAL_COMMUNITY)
Admission: EM | Admit: 2017-10-23 | Discharge: 2017-10-23 | Disposition: A | Discharge status: Home / Self Care | Payer: BC Managed Care – PPO | Attending: Urgent Care | Admitting: Urgent Care

## 2017-10-23 ENCOUNTER — Other Ambulatory Visit: Payer: Self-pay

## 2017-10-23 DIAGNOSIS — M542 Cervicalgia: Secondary | ICD-10-CM

## 2017-10-23 DIAGNOSIS — M62838 Other muscle spasm: Secondary | ICD-10-CM | POA: Diagnosis not present

## 2017-10-23 MED ORDER — KETOROLAC TROMETHAMINE 60 MG/2ML IM SOLN
INTRAMUSCULAR | Status: AC
  Filled 2017-10-23: qty 2

## 2017-10-23 MED ORDER — KETOROLAC TROMETHAMINE 60 MG/2ML IM SOLN
60.0000 mg | Freq: Once | INTRAMUSCULAR | Status: AC
  Administered 2017-10-23: 60 mg via INTRAMUSCULAR

## 2017-10-23 MED ORDER — CYCLOBENZAPRINE HCL 5 MG PO TABS: 5.0000 mg | ORAL_TABLET | Freq: Three times a day (TID) | ORAL | 0 refills | Status: DC | PRN

## 2017-10-23 MED ORDER — MELOXICAM 7.5 MG PO TABS: 7.5000 mg | ORAL_TABLET | Freq: Every day | ORAL | 0 refills | Status: DC

## 2017-10-23 NOTE — ED Provider Notes (Signed)
  MRN: [id number] DOB: [birth year]  Subjective:   Renee Avery is a 42 y.o. female presenting for 2 day history of right-sided neck pain, right shoulder blade pain. Symptoms started after getting up from her couch. Pain is throbbing, dull at rest, worse with movement radiates up the back of her head up to her forehead.  Has tried 800mg  ibuprofen with minimal relief. Denies fever, radicular pain, weakness, numbness or tingling, stiffness, trauma. Denies smoking cigarettes.  Renee Avery takes Microgestin and is allergic to other.  Renee Avery  has a past medical history of Allergy, Anemia, Eczema, Hemorrhoid, and Obesity. Also  has a past surgical history that includes uterine biopsy.  Objective:   Vitals: BP (!) 129/50   Pulse (!) 57   Temp 98.5 F (36.9 C)   Resp 16   SpO2 100%   Physical Exam  Constitutional: She is oriented to person, place, and time. She appears well-developed and well-nourished.  Cardiovascular: Normal rate.  Pulmonary/Chest: Effort normal.  Musculoskeletal:       Cervical back: She exhibits decreased range of motion (in all directions worse with lateral rotation to the right), tenderness and spasm. She exhibits no bony tenderness, no swelling, no edema, no deformity and no laceration.       Back:  Neurological: She is alert and oriented to person, place, and time. Coordination normal.  Skin: Skin is warm and dry.  Psychiatric: She has a normal mood and affect.   Assessment and Plan :   Neck pain  Trapezius muscle spasm  Will hold off on x-ray for now. Hydrate well, start NSAID with Flexeril, modification of activities. Return-to-clinic precautions discussed, patient verbalized understanding.    Jaynee Eagles, [place] 10/23/17 1554

## 2017-10-23 NOTE — ED Triage Notes (Signed)
Pt states she was lying on the cough and was sitting funny, when she got up she felt like she hurt her neck and her R shoulder blade. Pt c/o pain when she looks to her right, pain when she lifts her R arm.

## 2018-03-14 ENCOUNTER — Other Ambulatory Visit: Payer: Self-pay | Admitting: Radiology

## 2018-04-02 ENCOUNTER — Other Ambulatory Visit: Payer: Self-pay | Admitting: Urgent Care

## 2019-03-21 ENCOUNTER — Emergency Department (HOSPITAL_COMMUNITY): Payer: BC Managed Care – PPO

## 2019-03-21 ENCOUNTER — Emergency Department (HOSPITAL_COMMUNITY)
Admission: EM | Admit: 2019-03-21 | Discharge: 2019-03-21 | Disposition: A | Discharge status: Home / Self Care | Payer: BC Managed Care – PPO | Attending: Emergency Medicine | Admitting: Emergency Medicine

## 2019-03-21 ENCOUNTER — Encounter (HOSPITAL_COMMUNITY): Payer: Self-pay | Admitting: Emergency Medicine

## 2019-03-21 DIAGNOSIS — Z87891 Personal history of nicotine dependence: Secondary | ICD-10-CM | POA: Insufficient documentation

## 2019-03-21 DIAGNOSIS — Z79899 Other long term (current) drug therapy: Secondary | ICD-10-CM | POA: Insufficient documentation

## 2019-03-21 DIAGNOSIS — R51 Headache: Secondary | ICD-10-CM | POA: Insufficient documentation

## 2019-03-21 DIAGNOSIS — R079 Chest pain, unspecified: Secondary | ICD-10-CM | POA: Diagnosis not present

## 2019-03-21 DIAGNOSIS — R0789 Other chest pain: Secondary | ICD-10-CM | POA: Diagnosis present

## 2019-03-21 LAB — BASIC METABOLIC PANEL
Anion gap: 8 (ref 5–15)
BUN: 10 mg/dL (ref 6–20)
CO2: 24 mmol/L (ref 22–32)
Calcium: 8.4 mg/dL — ABNORMAL LOW (ref 8.9–10.3)
Chloride: 104 mmol/L (ref 98–111)
Creatinine, Ser: 0.67 mg/dL (ref 0.44–1.00)
GFR calc Af Amer: >60 mL/min (ref >60)
GFR calc non Af Amer: >60 mL/min (ref >60)
Glucose, Bld: 80 mg/dL (ref 70–99)
Potassium: 3.6 mmol/L (ref 3.5–5.1)
Sodium: 136 mmol/L (ref 135–145)

## 2019-03-21 LAB — I-STAT BETA HCG BLOOD, ED (MC, WL, AP ONLY): I-stat hCG, quantitative: <5 m[IU]/mL (ref <5)

## 2019-03-21 LAB — CBC
HCT: 37.4 % (ref 36.0–46.0)
Hemoglobin: 10.8 g/dL — ABNORMAL LOW (ref 12.0–15.0)
MCH: 20.1 pg — ABNORMAL LOW (ref 26.0–34.0)
MCHC: 28.9 g/dL — ABNORMAL LOW (ref 30.0–36.0)
MCV: 69.8 fL — ABNORMAL LOW (ref 80.0–100.0)
Platelets: 302 10*3/uL (ref 150–400)
RBC: 5.36 MIL/uL — ABNORMAL HIGH (ref 3.87–5.11)
RDW: 18.2 % — ABNORMAL HIGH (ref 11.5–15.5)
WBC: 7.3 10*3/uL (ref 4.0–10.5)
nRBC: 0 % (ref 0.0–0.2)

## 2019-03-21 LAB — TROPONIN I
Troponin I: <0.03 ng/mL (ref <0.03)
Troponin I: <0.03 ng/mL (ref <0.03)

## 2019-03-21 MED ORDER — PANTOPRAZOLE SODIUM 20 MG PO TBEC: 20.0000 mg | DELAYED_RELEASE_TABLET | Freq: Every day | ORAL | 0 refills | Status: DC

## 2019-03-21 MED ORDER — LIDOCAINE VISCOUS HCL 2 % MT SOLN
15.0000 mL | Freq: Once | OROMUCOSAL | Status: AC
  Administered 2019-03-21: 15 mL via OROMUCOSAL
  Filled 2019-03-21: qty 15

## 2019-03-21 MED ORDER — SODIUM CHLORIDE 0.9% FLUSH: 3.0000 mL | Freq: Once | INTRAVENOUS | Status: DC

## 2019-03-21 MED ORDER — ALUM & MAG HYDROXIDE-SIMETH 200-200-20 MG/5ML PO SUSP
30.0000 mL | Freq: Once | ORAL | Status: AC
  Administered 2019-03-21: 30 mL via ORAL
  Filled 2019-03-21: qty 30

## 2019-03-21 NOTE — ED Provider Notes (Signed)
[facility] DEPT Provider Note   CSN: [id number] Arrival date & time: 03/21/19  1814    History   Chief Complaint Chief Complaint  Patient presents with  . Headache  . Chest Pain    HPI Renee Avery is a 43 y.o. female.  She has no significant past medical history.  She is complaining of some chest pressure substernal that started after spaghetti dinner last night.  It ultimately improved and she was able to get some sleep but recurred again this afternoon while she was driving.  It was associated with a mild to moderate headache.  It was not associated with any shortness of breath diaphoresis nausea vomiting dizziness or lightheadedness.  She said the pain is there but very mild and her headache is resolved.  She is tried nothing for it.  HPI: A 43 year old patient with a history of obesity presents for evaluation of chest pain. Initial onset of pain was approximately 3-6 hours ago. The patient's chest pain is described as heaviness/pressure/tightness and is not worse with exertion. The patient's chest pain is middle- or left-sided, is not well-localized, is not sharp and does not radiate to the arms/jaw/neck. The patient does not complain of nausea and denies diaphoresis. The patient has no history of stroke, has no history of peripheral artery disease, has not smoked in the past 90 days, denies any history of treated diabetes, has no relevant family history of coronary artery disease (first degree relative at less than age 19), is not hypertensive and has no history of hypercholesterolemia.   The history is provided by the patient.  Chest Pain Timing:  Constant Progression:  Partially resolved Chronicity:  New Context: at rest   Relieved by:  None tried Worsened by:  Nothing Ineffective treatments:  None tried Associated symptoms: headache   Associated symptoms: no abdominal pain, no cough, no diaphoresis, no fever, no lower extremity edema, no shortness  of breath and no vomiting   Risk factors: no coronary artery disease, no diabetes mellitus, no high cholesterol, no hypertension, no prior DVT/PE and no smoking     Past Medical History:  Diagnosis Date  . Allergy    bug bite; hives  . Anemia   . Eczema   . Hemorrhoid   . Obesity     Patient Active Problem List   Diagnosis Date Noted  . Recurrent urticaria 11/07/2016  . Allergic reaction 11/07/2016    Past Surgical History:  Procedure Laterality Date  . uterine biopsy     benign     OB History   No obstetric history on file.      Home Medications    Prior to Admission medications   Medication Sig Start Date End Date Taking? Authorizing Provider  cyclobenzaprine (FLEXERIL) 5 MG tablet Take 1 tablet (5 mg total) by mouth 3 (three) times daily as needed for muscle spasms. 10/23/17   Jaynee Eagles, PA-C  diphenhydrAMINE (BENADRYL) 25 mg capsule Take 25 mg by mouth every 6 (six) hours as needed.    [provider]  EPINEPHrine 0.3 mg/0.3 mL IJ SOAJ injection Use as directed for severe allergic reaction 11/07/16   Bobbitt, Sedalia Muta, MD  ferrous sulfate 325 (65 FE) MG EC tablet Take 325 mg by mouth daily.     [provider]  levocetirizine (XYZAL) 5 MG tablet Take 1 tablet (5 mg total) by mouth every evening. 11/07/16   Bobbitt, Sedalia Muta, MD  meloxicam (MOBIC) 7.5 MG tablet Take 1 tablet (  7.5 mg total) by mouth daily. 10/23/17   Jaynee Eagles, PA-C  Multiple Vitamin (MULTIVITAMIN) capsule Take 1 capsule by mouth daily.    [provider]  mupirocin ointment (BACTROBAN) 2 %  10/25/16   [provider]  norethindrone-ethinyl estradiol (MICROGESTIN,JUNEL,LOESTRIN) 1-20 MG-MCG tablet Take 1 tablet by mouth daily.    [provider]  triamcinolone ointment (KENALOG) 0.1 %  10/25/16   [provider]    Family History Family History  Problem Relation Age of Onset  . Colon cancer Mother        in her 60s  . Colon polyps  Mother   . Heart attack Father   . Asthma Sister   . Eczema Sister   . Food Allergy Sister        shellfish, chocolate  . Eczema Sister   . Allergic rhinitis Neg Hx   . Angioedema Neg Hx     Social History Social History   Tobacco Use  . Smoking status: Former Smoker    Years: 2.00    Types: Cigarettes  . Smokeless tobacco: Never Used  Substance Use Topics  . Alcohol use: Yes    Comment: social  . Drug use: No     Allergies   Other   Review of Systems Review of Systems  Constitutional: Negative for diaphoresis and fever.  HENT: Negative for sore throat.   Eyes: Negative for visual disturbance.  Respiratory: Negative for cough and shortness of breath.   Cardiovascular: Positive for chest pain.  Gastrointestinal: Negative for abdominal pain and vomiting.  Genitourinary: Negative for dysuria.  Musculoskeletal: Negative for neck pain.  Skin: Negative for rash.  Neurological: Positive for headaches.     Physical Exam Updated Vital Signs BP 120/74   Pulse (!) 47   Temp 99.3 F (37.4 C) (Oral)   Resp 14   LMP 03/21/2019   SpO2 100%   Physical Exam Vitals signs and nursing note reviewed.  Constitutional:      General: She is not in acute distress.    Appearance: She is well-developed.  HENT:     Head: Normocephalic and atraumatic.  Eyes:     Conjunctiva/sclera: Conjunctivae normal.  Neck:     Musculoskeletal: Neck supple.  Cardiovascular:     Rate and Rhythm: Normal rate and regular rhythm.     Heart sounds: No murmur.  Pulmonary:     Effort: Pulmonary effort is normal. No respiratory distress.     Breath sounds: Normal breath sounds.  Abdominal:     Palpations: Abdomen is soft.     Tenderness: There is no abdominal tenderness.  Musculoskeletal:        General: No tenderness.     Right lower leg: No edema.     Left lower leg: No edema.  Skin:    General: Skin is warm and dry.     Capillary Refill: Capillary refill takes less than 2 seconds.   Neurological:     General: No focal deficit present.     Mental Status: She is alert and oriented to person, place, and time.     Cranial Nerves: No cranial nerve deficit.     Sensory: No sensory deficit.     Motor: No weakness.     Gait: Gait normal.      ED Treatments / Results  Labs (all labs ordered are listed, but only abnormal results are displayed) Labs Reviewed  BASIC METABOLIC PANEL - Abnormal; Notable for the following components:  Result Value   Calcium 8.4 (*)    All other components within normal limits  CBC - Abnormal; Notable for the following components:   RBC 5.36 (*)    Hemoglobin 10.8 (*)    MCV 69.8 (*)    MCH 20.1 (*)    MCHC 28.9 (*)    RDW 18.2 (*)    All other components within normal limits  TROPONIN I  TROPONIN I  I-STAT BETA HCG BLOOD, ED ([facility], [facility], [facility] ONLY)    EKG EKG Interpretation  Date/Time:  Thursday March 21 2019 18:27:46 EDT Ventricular Rate:  63 PR Interval:    QRS Duration: 89 QT Interval:  402 QTC Calculation: 412 R Axis:   12 Text Interpretation:  Sinus rhythm no prior to compare with Confirmed by Aletta Edouard [phone]) on 03/21/2019 8:38:56 PM   Radiology Dg Chest 2 View  Result Date: 03/21/2019 CLINICAL DATA:  Chest pain. EXAM: CHEST - 2 VIEW COMPARISON:  None. FINDINGS: The heart size and mediastinal contours are within normal limits. Both lungs are clear. No pneumothorax or pleural effusion is noted. The visualized skeletal structures are unremarkable. IMPRESSION: No active cardiopulmonary disease. Electronically Signed   By: Marijo Conception M.D.   On: 03/21/2019 18:56    Procedures Procedures (including critical care time)  Medications Ordered in ED Medications - No data to display   Initial Impression / Assessment and Plan / ED Course  I have reviewed the triage vital signs and the nursing notes.  Pertinent labs & imaging results that were available during my care of the patient were reviewed by me and  considered in my medical decision making (see chart for details).  Clinical Course as of Mar 21 2039  Thu Mar 21, 2019  2039 Differential diagnosis includes ACS, pneumonia, pneumothorax, GERD, gastritis, PE.  EKG lab work troponin unremarkable.  Patient is not tachycardic or tachypneic and is satting 100% on room air.  Doubt PE.  Will try GI cocktail and get a delta Trope.   [MB]    Clinical Course User Index [MB] Hayden Rasmussen, MD    HEAR Score: 2   Final Clinical Impressions(s) / ED Diagnoses   Final diagnoses:  Nonspecific chest pain    ED Discharge Orders         Ordered    pantoprazole (PROTONIX) 20 MG tablet  Daily,   Status:  Discontinued     03/21/19 2133    pantoprazole (PROTONIX) 20 MG tablet  Daily     03/21/19 2219           Hayden Rasmussen, MD 03/22/19 [id number]

## 2019-03-21 NOTE — ED Notes (Signed)
Bed: MO06 Expected date:  Expected time:  Means of arrival:  Comments: Hold for triage 1

## 2019-03-21 NOTE — ED Triage Notes (Signed)
Per pt, states she started experiencing chest tightness after eating spaghetti last night-had it when she woke up this am along with a headache-states she takes BCP

## 2019-03-21 NOTE — Discharge Instructions (Addendum)
You were seen in the emergency department for intermittent chest pain and headache.  You had blood work EKG and a chest x-ray that did not show any obvious findings.  We are prescribing you some acid medication for your stomach.  Please contact your primary care doctor for follow-up.  Return if any concerns.

## 2019-12-08 ENCOUNTER — Ambulatory Visit: Payer: BC Managed Care – PPO | Attending: Internal Medicine

## 2019-12-08 ENCOUNTER — Ambulatory Visit: Payer: BC Managed Care – PPO

## 2019-12-08 DIAGNOSIS — Z23 Encounter for immunization: Secondary | ICD-10-CM | POA: Insufficient documentation

## 2019-12-08 NOTE — Progress Notes (Signed)
   Covid-19 Vaccination Clinic  Name:  Delcia Danks    MRN: [id number] DOB: [birth year]  12/08/2019  Ms. Propes was observed post Covid-19 immunization for 15 minutes without incidence. She was provided with Vaccine Information Sheet and instruction to access the V-Safe system.   Ms. Alfred was instructed to call 911 with any severe reactions post vaccine: Marland Kitchen Difficulty breathing  . Swelling of your face and throat  . A fast heartbeat  . A bad rash all over your body  . Dizziness and weakness    Immunizations Administered    Name Date Dose VIS Date Route   Moderna COVID-19 Vaccine 12/08/2019  4:46 PM 0.5 mL 09/10/2019 Intramuscular   Manufacturer: Moderna   Lot: [id number]   [id number]

## 2020-01-11 ENCOUNTER — Ambulatory Visit: Payer: BC Managed Care – PPO | Attending: Internal Medicine

## 2020-01-11 DIAGNOSIS — Z23 Encounter for immunization: Secondary | ICD-10-CM

## 2020-01-11 NOTE — Progress Notes (Signed)
   Covid-19 Vaccination Clinic  Name:  Renee Avery    MRN: [id number] DOB: 1976-04-09  01/11/2020  [name] was observed post Covid-19 immunization for 15 minutes without incident. She was provided with Vaccine Information Sheet and instruction to access the V-Safe system.   [name] was instructed to call 911 with any severe reactions post vaccine: Marland Kitchen Difficulty breathing  . Swelling of face and throat  . A fast heartbeat  . A bad rash all over body  . Dizziness and weakness   Immunizations Administered    Name Date Dose VIS Date Route   Moderna COVID-19 Vaccine 01/11/2020 11:56 AM 0.5 mL 09/10/2019 Intramuscular   Manufacturer: Moderna   Lot: [id number]   [id number]

## 2020-04-11 ENCOUNTER — Other Ambulatory Visit: Payer: Self-pay

## 2020-04-11 ENCOUNTER — Emergency Department (HOSPITAL_COMMUNITY): Payer: BC Managed Care – PPO

## 2020-04-11 ENCOUNTER — Emergency Department (HOSPITAL_COMMUNITY)
Admission: EM | Admit: 2020-04-11 | Discharge: 2020-04-11 | Disposition: A | Discharge status: Home / Self Care | Payer: BC Managed Care – PPO | Attending: Emergency Medicine | Admitting: Emergency Medicine

## 2020-04-11 ENCOUNTER — Encounter (HOSPITAL_COMMUNITY): Payer: Self-pay | Admitting: Emergency Medicine

## 2020-04-11 DIAGNOSIS — Z87891 Personal history of nicotine dependence: Secondary | ICD-10-CM | POA: Diagnosis not present

## 2020-04-11 DIAGNOSIS — M79602 Pain in left arm: Secondary | ICD-10-CM | POA: Insufficient documentation

## 2020-04-11 DIAGNOSIS — M79632 Pain in left forearm: Secondary | ICD-10-CM | POA: Diagnosis present

## 2020-04-11 MED ORDER — IBUPROFEN 200 MG PO TABS
600.0000 mg | ORAL_TABLET | Freq: Once | ORAL | Status: AC
Start: 2020-04-11 — End: 2020-04-11
  Administered 2020-04-11: 600 mg via ORAL
  Filled 2020-04-11: qty 3

## 2020-04-11 MED ORDER — ACETAMINOPHEN 500 MG PO TABS
500.0000 mg | ORAL_TABLET | Freq: Once | ORAL | Status: AC
  Administered 2020-04-11: 500 mg via ORAL
  Filled 2020-04-11: qty 1

## 2020-04-11 NOTE — ED Provider Notes (Signed)
[name] DEPT Provider Note   CSN: [id number] Arrival date & time: 04/11/20  1746     History Chief Complaint  Patient presents with  . Arm Injury    Renee Avery is a 44 y.o. right-hand-dominant female with past medical history significant for anemia, hemorrhoids, obesity.  HPI Patient presents to emergency department today with chief complaint of left forearm pain x2 days.  Patient had a mechanical fall while standing in her kitchen.  She states kitchen is very small.  She turned around while doing the dishes and accidentally lost her balance in her left forearm on the stove handle.  She denies falling to the ground, hitting her head, or loss of consciousness.  She is having sharp pain in her left forearm.  She states it will radiate with movement or palpation.  She rates the pain 8 of 10 in severity.  She does not take any medication for symptoms prior to arrival.  She denies any fever, chills, swelling, numbness, weakness.  LMP x2 days ago.    Past Medical History:  Diagnosis Date  . Allergy    bug bite; hives  . Anemia   . Eczema   . Hemorrhoid   . Obesity     Patient Active Problem List   Diagnosis Date Noted  . Recurrent urticaria 11/07/2016  . Allergic reaction 11/07/2016    Past Surgical History:  Procedure Laterality Date  . uterine biopsy     benign     OB History   No obstetric history on file.     Family History  Problem Relation Age of Onset  . Colon cancer Mother        in her 84s  . Colon polyps Mother   . Heart attack Father   . Asthma Sister   . Eczema Sister   . Food Allergy Sister        shellfish, chocolate  . Eczema Sister   . Allergic rhinitis Neg Hx   . Angioedema Neg Hx     Social History   Tobacco Use  . Smoking status: Former Smoker    Years: 2.00    Types: Cigarettes  . Smokeless tobacco: Never Used  Substance Use Topics  . Alcohol use: Yes    Comment: social  . Drug use: No    Home  Medications Prior to Admission medications   Medication Sig Start Date End Date Taking? Authorizing Provider  cyclobenzaprine (FLEXERIL) 5 MG tablet Take 1 tablet (5 mg total) by mouth 3 (three) times daily as needed for muscle spasms. Patient not taking: Reported on 03/21/2019 10/23/17   Jaynee Eagles, PA-C  diphenhydrAMINE (BENADRYL) 25 mg capsule Take 25 mg by mouth every 6 (six) hours as needed.    [provider]  EPINEPHrine 0.3 mg/0.3 mL IJ SOAJ injection Use as directed for severe allergic reaction Patient not taking: Reported on 03/21/2019 11/07/16   Bobbitt, Sedalia Muta, MD  ferrous sulfate 325 (65 FE) MG EC tablet Take 325 mg by mouth daily.     [provider]  ibuprofen (ADVIL) 200 MG tablet Take 600-800 mg by mouth every 6 (six) hours as needed for moderate pain or cramping.    [provider]  JUNEL FE 1/20 1-20 MG-MCG tablet Take 1 tablet by mouth daily. 12/28/18   [provider]  levocetirizine (XYZAL) 5 MG tablet Take 1 tablet (5 mg total) by mouth every evening. Patient not taking: Reported on 03/21/2019 11/07/16   Bobbitt,  Sedalia Muta, MD  meloxicam (MOBIC) 7.5 MG tablet Take 1 tablet (7.5 mg total) by mouth daily. Patient not taking: Reported on 03/21/2019 10/23/17   Jaynee Eagles, PA-C  Multiple Vitamin (MULTIVITAMIN) capsule Take 1 capsule by mouth daily.    [provider]  pantoprazole (PROTONIX) 20 MG tablet Take 1 tablet (20 mg total) by mouth daily. 03/21/19   Hayden Rasmussen, MD    Allergies    Other  Review of Systems   Review of Systems  All other systems are reviewed and are negative for acute change except as noted in the HPI.   Physical Exam Updated Vital Signs BP 134/79 (BP Location: Right Arm)   Pulse 66   Temp 98.5 F (36.9 C) (Oral)   Resp 17   Ht 5' 2.5" (1.588 m)   Wt 97.5 kg   LMP 04/11/2020   SpO2 100%   BMI 38.70 kg/m   Physical Exam Vitals and nursing note reviewed.  Constitutional:       Appearance: She is well-developed. She is not ill-appearing or toxic-appearing.  HENT:     Head: Normocephalic and atraumatic.     Nose: Nose normal.  Eyes:     General: No scleral icterus.       Right eye: No discharge.        Left eye: No discharge.     Conjunctiva/sclera: Conjunctivae normal.  Neck:     Vascular: No JVD.  Cardiovascular:     Rate and Rhythm: Normal rate and regular rhythm.     Pulses: Normal pulses.          Radial pulses are 2+ on the right side and 2+ on the left side.     Heart sounds: Normal heart sounds.  Pulmonary:     Effort: Pulmonary effort is normal.     Breath sounds: Normal breath sounds.  Abdominal:     General: There is no distension.  Musculoskeletal:        General: Normal range of motion.     Left shoulder: Normal.     Left upper arm: Normal.     Left elbow: Normal.       Arms:     Cervical back: Normal range of motion.     Comments: Tenderness to palpation is depicted in image above.  No overlying skin changes.  No swelling.  Sensation is intact.  There is no joint effusion noted. Full ROM without pain. Decreased ROM 2/2 pain. No erythema or warmth overlaying the joint. There is no anatomic snuff box tenderness. Normal sensation and motor function in the median, ulnar, and radial nerve distributions. 2+ radial pulse.  Left upper extremity is neurovascularly intact.  Strong and equal grip strength in bilateral upper extremities.    Skin:    General: Skin is warm and dry.  Neurological:     Mental Status: She is oriented to person, place, and time.     GCS: GCS eye subscore is 4. GCS verbal subscore is 5. GCS motor subscore is 6.     Comments: Fluent speech, no facial droop.  Psychiatric:        Behavior: Behavior normal.     ED Results / Procedures / Treatments   Labs (all labs ordered are listed, but only abnormal results are displayed) Labs Reviewed - No data to display  EKG None  Radiology DG Forearm Left  Result Date:  04/11/2020 CLINICAL DATA:  44 year old female with fall and trauma to the left  upper extremity. EXAM: LEFT FOREARM - 2 VIEW COMPARISON:  None. FINDINGS: There is no evidence of fracture or other focal bone lesions. Soft tissues are unremarkable. IMPRESSION: Negative. Electronically Signed   By: Anner Crete M.D.   On: 04/11/2020 19:16    Procedures Procedures (including critical care time)  Medications Ordered in ED Medications  acetaminophen (TYLENOL) tablet 500 mg (has no administration in time range)  ibuprofen (ADVIL) tablet 600 mg (has no administration in time range)    ED Course  I have reviewed the triage vital signs and the nursing notes.  Pertinent labs & imaging results that were available during my care of the patient were reviewed by me and considered in my medical decision making (see chart for details).    MDM Rules/Calculators/A&P                          History provided by patient with additional history obtained from chart review.    Patient presents to the ED with complaints of pain to the left forearm s/p injury mechanical fall x 2 days ago. Exam without obvious deformity or open wounds. ROM intact. Tender to palpation of left posterior forearm. NVI distally. Xray negative for fracture/dislocation.  Patient given ibuprofen Tylenol for pain. I discussed results, treatment plan, need for follow-up, and return precautions with the patient. Provided opportunity for questions, patient confirmed understanding and are in agreement with plan.  Recommend he follow-up with PCP if symptoms persist.  Portions of this note were generated with Dragon dictation software. Dictation errors may occur despite best attempts at proofreading.  Final Clinical Impression(s) / ED Diagnoses Final diagnoses:  Left arm pain    Rx / DC Orders ED Discharge Orders    None       Flint Melter 04/11/20 2037    Malvin Johns, MD 04/12/20 1504

## 2020-04-11 NOTE — ED Triage Notes (Signed)
Patient here from home reporting left arm pain after fall today. Reports posterior arm pain. Able to move.

## 2020-04-11 NOTE — Discharge Instructions (Signed)
You were seen today for pain in your left forearm. Please read and follow all provided instructions.   X-ray did not show any broken bones or dislocations.  1. Medications: alternate ibuprofen and tylenol for pain control, usual home medications  2. Treatment: rest,ice, gentle stretching  3. Follow Up: Please followup your PCP in 1 week if no improvement for discussion of your diagnoses and further evaluation after today's visit; if you do not have a primary care doctor use the resource guide provided to find one; Please return to the ER for worsening symptoms or other concerns

## 2020-04-29 DIAGNOSIS — M4302 Spondylolysis, cervical region: Secondary | ICD-10-CM | POA: Insufficient documentation

## 2020-04-30 DIAGNOSIS — R7303 Prediabetes: Secondary | ICD-10-CM | POA: Insufficient documentation

## 2020-04-30 DIAGNOSIS — D500 Iron deficiency anemia secondary to blood loss (chronic): Secondary | ICD-10-CM | POA: Insufficient documentation

## 2020-04-30 DIAGNOSIS — E559 Vitamin D deficiency, unspecified: Secondary | ICD-10-CM | POA: Insufficient documentation

## 2020-06-16 DIAGNOSIS — M5030 Other cervical disc degeneration, unspecified cervical region: Secondary | ICD-10-CM | POA: Insufficient documentation

## 2021-10-18 ENCOUNTER — Encounter: Payer: Self-pay | Admitting: Gastroenterology

## 2021-11-05 ENCOUNTER — Other Ambulatory Visit: Payer: Self-pay

## 2021-11-05 DIAGNOSIS — D219 Benign neoplasm of connective and other soft tissue, unspecified: Secondary | ICD-10-CM | POA: Insufficient documentation

## 2021-11-09 ENCOUNTER — Encounter: Payer: Self-pay | Admitting: Gastroenterology

## 2021-11-09 ENCOUNTER — Ambulatory Visit: Payer: BC Managed Care – PPO | Admitting: Gastroenterology

## 2021-11-09 VITALS — BP 132/70 | HR 56 | Ht 62.5 in | Wt 228.0 lb

## 2021-11-09 DIAGNOSIS — K625 Hemorrhage of anus and rectum: Secondary | ICD-10-CM | POA: Diagnosis not present

## 2021-11-09 DIAGNOSIS — R1030 Lower abdominal pain, unspecified: Secondary | ICD-10-CM

## 2021-11-09 DIAGNOSIS — R12 Heartburn: Secondary | ICD-10-CM

## 2021-11-09 MED ORDER — PLENVU 140 G PO SOLR: 140.0000 g | ORAL | 0 refills | Status: DC

## 2021-11-09 NOTE — Progress Notes (Signed)
[facility] Consult Note:  History: Puneet Selden 11/09/2021  Referring provider: [facility], [facility]  Reason for consult/chief complaint: Abdominal Pain (Lower abdominal pain and cramping. Continues to the rectum and gets spasms. Has seen [facility] with wiping after a BM when this happens. Tends to happen around her menstrual cycle.) and Gastroesophageal Reflux (Chest feels heavy after eating. Takes Pepcid only as needed.)   Subjective  HPI:  Renee Avery came to see me for abdominal pain and rectal bleeding.  For about the last 6 months she has had episodic bandlike acute onset brief episodes of sharp abdominal pain, usually lasting minutes.  It sometimes radiates down toward the rectum where she also feels it as a cramping sensation.  This might be associated with a feeling of urgency, though often no bowel movement occurs.  She typically has a BM twice a day and feels there is been no change in that pattern.  Some of these episodes have been associated with blood on the paper or in the toilet bowl, and overall this is all been occurring more frequently in recent months.  Sometimes the episodes seem more frequent during her menstrual cycle, which has been reportedly regular. She also describes a chest discomfort, usually occurring after meals and described as heavy or a burning pressure sensation.  She feels that a belch would relieve it, which sometimes occurs.  She was prescribed PPI by primary care but did not want to take it since she preferred to understand the nature of the problem first.  She had also been prescribed Levsin by primary care for the abdominal cramps, and only took 1 dose but found it difficult to know if it had helped.  She denies dysphagia or weight loss.  Renee Avery has had chronic difficulty with painful fibroids and plans to see gynecology soon to address that.  Screening colonoscopy with me December 2017 (history of colon cancer in her  mother): Complete exam, excellent prep, apparent diminutive polyp (normal colon tissue on pathology), 5-year recall recommended.    ROS:  Review of Systems  Constitutional:  Negative for appetite change and unexpected weight change.  HENT:  Negative for mouth sores and voice change.   Eyes:  Negative for pain and redness.  Respiratory:  Negative for cough and shortness of breath.   Cardiovascular:  Negative for chest pain and palpitations.  Genitourinary:  Negative for dysuria and hematuria.  Musculoskeletal:  Negative for arthralgias and myalgias.  Skin:  Negative for pallor and rash.  Neurological:  Negative for weakness and headaches.  Hematological:  Negative for adenopathy.    Past Medical History: Past Medical History:  Diagnosis Date   Allergic urticaria    Allergy    bug bite; hives   Anemia    DDD (degenerative disc disease), cervical    Eczema    Hemorrhoid    Obesity    Prediabetes    Spondylolysis of cervical region    Vitamin D deficiency      Past Surgical History: Past Surgical History:  Procedure Laterality Date   uterine biopsy     benign     Family History: Family History  Problem Relation Age of Onset   Colon cancer Mother        in her 2s   Colon polyps Mother    Heart attack Father    Asthma Sister    Eczema Sister    Food Allergy Sister        shellfish, chocolate  Eczema Sister    Allergic rhinitis Neg Hx    Angioedema Neg Hx    Stomach cancer Neg Hx    Esophageal cancer Neg Hx    Pancreatic cancer Neg Hx     Social History: Social History   Socioeconomic History   Marital status: Single    Spouse name: Not on file   Number of children: 0   Years of education: Not on file   Highest education level: Not on file  Occupational History   Occupation: Environmental manager  Tobacco Use   Smoking status: Former    Years: 2.00    Types: Cigarettes   Smokeless tobacco: Never  Vaping Use   Vaping Use: Never used  Substance  and Sexual Activity   Alcohol use: Yes    Comment: social   Drug use: No   Sexual activity: Yes  Other Topics Concern   Not on file  Social History Narrative   Not on file   Social Determinants of Health   Financial Resource Strain: Not on file  Food Insecurity: Not on file  Transportation Needs: Not on file  Physical Activity: Not on file  Stress: Not on file  Social Connections: Not on file    Allergies: Allergies  Allergen Reactions   Other Anaphylaxis    Bug bites    Outpatient Meds: Current Outpatient Medications  Medication Sig Dispense Refill   acetaminophen (TYLENOL) 500 MG tablet Take by mouth.     cyclobenzaprine (FLEXERIL) 5 MG tablet Take 1 tablet (5 mg total) by mouth 3 (three) times daily as needed for muscle spasms. 90 tablet 0   famotidine (PEPCID) 20 MG tablet Take by mouth.     ferrous sulfate 325 (65 FE) MG EC tablet Take 325 mg by mouth daily.      Hyoscyamine Sulfate SL 0.125 MG SUBL Place under the tongue.     ibuprofen (ADVIL) 200 MG tablet Take 600-800 mg by mouth every 6 (six) hours as needed for moderate pain or cramping.     levocetirizine (XYZAL) 5 MG tablet Take 1 tablet (5 mg total) by mouth every evening. 30 tablet 5   meloxicam (MOBIC) 7.5 MG tablet Take 1 tablet (7.5 mg total) by mouth daily. 30 tablet 0   Multiple Vitamin (MULTIVITAMIN) capsule Take 1 capsule by mouth daily.     naproxen (NAPROSYN) 500 MG tablet Take by mouth.     pantoprazole (PROTONIX) 20 MG tablet Take 1 tablet (20 mg total) by mouth daily. 30 tablet 0   PEG-KCl-NaCl-NaSulf-Na Asc-C (PLENVU) 140 g SOLR Take 140 g by mouth as directed. Manufacturer's coupon Universal coupon code:BIN: P2366821; GROUP: [id number]; PCN: [id number]; ID: [id number]; PAY NO MORE $50 1 each 0   triamcinolone cream (KENALOG) 0.1 % Apply topically.     VITAMIN D, ERGOCALCIFEROL, PO Take 1 tablet by mouth daily.     No current facility-administered medications for this visit.       ___________________________________________________________________ Objective   Exam:  BP 132/70 (BP Location: Left Arm, Patient Position: Sitting, Cuff Size: Normal)    Pulse (!) 56    Ht 5' 2.5" (1.588 m)    Wt 228 lb (103.4 kg)    BMI 41.04 kg/m  Wt Readings from Last 3 Encounters:  11/09/21 228 lb (103.4 kg)  04/11/20 215 lb (97.5 kg)  11/07/16 224 lb 12.8 oz (102 kg)  She is bothered by weight gain  General: Well-appearing Eyes: sclera anicteric, no redness ENT: oral mucosa moist without  lesions, no cervical or supraclavicular lymphadenopathy CV: RRR without murmur, S1/S2, no JVD, no peripheral edema Resp: clear to auscultation bilaterally, normal RR and effort noted GI: soft, no tenderness, with active bowel sounds. No guarding or palpable organomegaly noted.  Enlarged firm uterus Skin; warm and dry, no rash or jaundice noted Neuro: awake, alert and oriented x 3. Normal gross motor function and fluent speech  Labs: No recent data for review  Assessment: Encounter Diagnoses  Name Primary?   Lower abdominal pain Yes   Rectal bleeding    Heartburn     Lower abdominal pain rectal bleeding with family history of colorectal cancer.  However, her bowel movements are regular, speaking against this likely being obstructive.  Unclear how or if it may be related to worsening of symptomatic fibroids, perhaps triggering intestinal spasm and some hemorrhoidal bleeding.  Nonexertional chest pain also somewhat difficult to characterize, possibly reflux related.  She prefer not to eat acid suppression medicine regularly without further evaluation.  Plan:  She was strongly encouraged to contact gynecology soon for reevaluation of her symptomatic fibroids.  Upper endoscopy and colonoscopy.  She was agreeable after discussion of procedure and risks.  The benefits and risks of the planned procedure were described in detail with the patient or (when appropriate) their health care  proxy.  Risks were outlined as including, but not limited to, bleeding, infection, perforation, adverse medication reaction leading to cardiac or pulmonary decompensation, pancreatitis (if ERCP).  The limitation of incomplete mucosal visualization was also discussed.  No guarantees or warranties were given.   Thank you for the courtesy of this consult.  Please call me with any questions or concerns.  Nelida Meuse III  CC: Referring provider noted above

## 2021-11-09 NOTE — Patient Instructions (Signed)
If you are age 46 or older, your body mass index should be between 23-30. Your Body mass index is 41.04 kg/m. If this is out of the aforementioned range listed, please consider follow up with your Primary Care Provider.  If you are age 71 or younger, your body mass index should be between 19-25. Your Body mass index is 41.04 kg/m. If this is out of the aformentioned range listed, please consider follow up with your Primary Care Provider.   ________________________________________________________  The [facility] providers would like to encourage you to use [facility] to communicate with providers for non-urgent requests or questions.  Due to long hold times on the telephone, sending your provider a message by [facility] may be a faster and more efficient way to get a response.  Please allow 48 business hours for a response.  Please remember that this is for non-urgent requests.  _______________________________________________________  Dennis Bast have been scheduled for a EGD/colonoscopy. Please follow written instructions given to you at your visit today.  Please pick up your prep supplies at the pharmacy within the next 1-3 days. If you use inhalers (even only as needed), please bring them with you on the day of your procedure.  It was a pleasure to see you today!  Thank you for trusting me with your gastrointestinal care!

## 2021-11-16 ENCOUNTER — Encounter: Payer: Self-pay | Admitting: *Deleted

## 2021-11-16 ENCOUNTER — Telehealth: Payer: Self-pay | Admitting: Gastroenterology

## 2021-11-16 NOTE — Telephone Encounter (Signed)
Updated instructions created in mychart for patient review.

## 2021-11-16 NOTE — Telephone Encounter (Signed)
Patient called to reschedule her procedure as she did not have a ride.  Her appointment is now for 12/16/21 at 3:00 p.m.  Since her procedure is now in the afternoon, will you please send her new instructions to reflect her time to her My Chart?  Thank you.

## 2021-11-22 ENCOUNTER — Encounter: Payer: BC Managed Care – PPO | Admitting: Gastroenterology

## 2021-12-16 ENCOUNTER — Ambulatory Visit (AMBULATORY_SURGERY_CENTER): Payer: BC Managed Care – PPO | Admitting: Gastroenterology

## 2021-12-16 ENCOUNTER — Encounter: Payer: Self-pay | Admitting: Gastroenterology

## 2021-12-16 ENCOUNTER — Other Ambulatory Visit: Payer: Self-pay

## 2021-12-16 VITALS — BP 111/63 | HR 66 | Temp 97.7°F | Resp 18 | Ht 62.0 in | Wt 228.0 lb

## 2021-12-16 DIAGNOSIS — K648 Other hemorrhoids: Secondary | ICD-10-CM

## 2021-12-16 DIAGNOSIS — R1030 Lower abdominal pain, unspecified: Secondary | ICD-10-CM | POA: Diagnosis present

## 2021-12-16 DIAGNOSIS — K5730 Diverticulosis of large intestine without perforation or abscess without bleeding: Secondary | ICD-10-CM | POA: Diagnosis not present

## 2021-12-16 DIAGNOSIS — K625 Hemorrhage of anus and rectum: Secondary | ICD-10-CM | POA: Diagnosis not present

## 2021-12-16 DIAGNOSIS — R12 Heartburn: Secondary | ICD-10-CM

## 2021-12-16 DIAGNOSIS — K219 Gastro-esophageal reflux disease without esophagitis: Secondary | ICD-10-CM

## 2021-12-16 DIAGNOSIS — D122 Benign neoplasm of ascending colon: Secondary | ICD-10-CM

## 2021-12-16 DIAGNOSIS — K2289 Other specified disease of esophagus: Secondary | ICD-10-CM | POA: Diagnosis not present

## 2021-12-16 MED ORDER — SODIUM CHLORIDE 0.9 % IV SOLN: 500.0000 mL | Freq: Once | INTRAVENOUS | Status: DC

## 2021-12-16 NOTE — Op Note (Signed)
[facility] ?Patient Name: Renee Avery ?Procedure Date: 12/16/2021 3:16 PM ?MRN: [id number] ?Endoscopist: Estill Cotta. Loletha Carrow , MD ?Age: 46 ?Referring MD:  ?Date of Birth: March 31, 1976 ?Gender: Female ?Account #: [id number] ?Procedure:                Colonoscopy ?Indications:              Lower abdominal pain, Rectal bleeding ?Medicines:                Monitored Anesthesia Care ?Procedure:                Pre-Anesthesia Assessment: ?                          - Prior to the procedure, a History and Physical  ?                          was performed, and patient medications and  ?                          allergies were reviewed. The patient's tolerance of  ?                          previous anesthesia was also reviewed. The risks  ?                          and benefits of the procedure and the sedation  ?                          options and risks were discussed with the patient.  ?                          All questions were answered, and informed consent  ?                          was obtained. Prior Anticoagulants: The patient has  ?                          taken no previous anticoagulant or antiplatelet  ?                          agents. ASA Grade Assessment: III - A patient with  ?                          severe systemic disease. After reviewing the risks  ?                          and benefits, the patient was deemed in  ?                          satisfactory condition to undergo the procedure. ?                          After obtaining informed consent, the colonoscope  ?  was passed under direct vision. Throughout the  ?                          procedure, the patient's blood pressure, pulse, and  ?                          oxygen saturations were monitored continuously. The  ?                          CF [id number] #[id number] was introduced through the anus  ?                          and advanced to the the terminal ileum, with  ?                          identification of the  appendiceal orifice and IC  ?                          valve. The colonoscopy was performed without  ?                          difficulty. The patient tolerated the procedure  ?                          well. The quality of the bowel preparation was  ?                          excellent. The terminal ileum, ileocecal valve,  ?                          appendiceal orifice, and rectum were photographed.  ?                          The bowel preparation used was Plenvu. ?Scope In: 3:25:58 PM ?Scope Out: 3:39:12 PM ?Scope Withdrawal Time: 0 hours 10 minutes 33 seconds  ?Total Procedure Duration: 0 hours 13 minutes 14 seconds  ?Findings:                 The perianal and digital rectal examinations were  ?                          normal. ?                          The terminal ileum appeared normal. ?                          Repeat examination of right colon under NBI  ?                          performed. ?                          Two sessile polyps were found in the ascending  ?  colon. The polyps were 2 to 4 mm in size. These  ?                          polyps were removed with a cold snare. Resection  ?                          and retrieval were complete. ?                          A few diverticula were found in the right colon. ?                          Internal hemorrhoids were found. The hemorrhoids  ?                          were small. ?                          The exam was otherwise without abnormality on  ?                          direct and retroflexion views. ?Complications:            No immediate complications. ?Estimated Blood Loss:     Estimated blood loss was minimal. ?Impression:               - The examined portion of the ileum was normal. ?                          - Two 2 to 4 mm polyps in the ascending colon,  ?                          removed with a cold snare. Resected and retrieved. ?                          - Diverticulosis in the right colon. ?                           - Internal hemorrhoids. ?                          - The examination was otherwise normal on direct  ?                          and retroflexion views. ?                          Episodic pain may be related to known fibroids. ?Recommendation:           - Patient has a contact number available for  ?                          emergencies. The signs and symptoms of potential  ?  delayed complications were discussed with the  ?                          patient. Return to normal activities tomorrow.  ?                          Written discharge instructions were provided to the  ?                          patient. ?                          - Resume previous diet. ?                          - Continue present medications. ?                          - Await pathology results. ?                          - Repeat colonoscopy in 5 years for surveillance  ?                          (polyps and family history of colon cancer). ?                          - See the other procedure note for documentation of  ?                          additional recommendations. ?Kyna Blahnik L. Loletha Carrow, MD ?12/16/2021 3:43:08 PM ?This report has been signed electronically. ?

## 2021-12-16 NOTE — Progress Notes (Signed)
History and Physical: ? This patient presents for endoscopic testing for: ?Encounter Diagnoses  ?Name Primary?  ? Lower abdominal pain Yes  ? Heartburn   ? ? ?Clinical details in 11/09/21 office consult note ? ?ROS: ?Patient denies chest pain or shortness of breath ? ? ?Past Medical History: ?Past Medical History:  ?Diagnosis Date  ? Allergic urticaria   ? Allergy   ? bug bite; hives  ? Anemia   ? DDD (degenerative disc disease), cervical   ? Eczema   ? Hemorrhoid   ? Obesity   ? Prediabetes   ? Spondylolysis of cervical region   ? Vitamin D deficiency   ? ? ? ?Past Surgical History: ?Past Surgical History:  ?Procedure Laterality Date  ? uterine biopsy    ? benign  ? ? ?Allergies: ?Allergies  ?Allergen Reactions  ? Other Anaphylaxis  ?  Bug bites  ? ? ?Outpatient Meds: ?Current Outpatient Medications  ?Medication Sig Dispense Refill  ? triamcinolone cream (KENALOG) 0.1 % Apply topically.    ? acetaminophen (TYLENOL) 500 MG tablet Take by mouth.    ? cyclobenzaprine (FLEXERIL) 5 MG tablet Take 1 tablet (5 mg total) by mouth 3 (three) times daily as needed for muscle spasms. 90 tablet 0  ? famotidine (PEPCID) 20 MG tablet Take by mouth.    ? ferrous sulfate 325 (65 FE) MG EC tablet Take 325 mg by mouth daily.     ? Hyoscyamine Sulfate SL 0.125 MG SUBL Place under the tongue.    ? ibuprofen (ADVIL) 200 MG tablet Take 600-800 mg by mouth every 6 (six) hours as needed for moderate pain or cramping.    ? levocetirizine (XYZAL) 5 MG tablet Take 1 tablet (5 mg total) by mouth every evening. 30 tablet 5  ? meloxicam (MOBIC) 7.5 MG tablet Take 1 tablet (7.5 mg total) by mouth daily. 30 tablet 0  ? Multiple Vitamin (MULTIVITAMIN) capsule Take 1 capsule by mouth daily.    ? naproxen (NAPROSYN) 500 MG tablet Take by mouth.    ? pantoprazole (PROTONIX) 20 MG tablet Take 1 tablet (20 mg total) by mouth daily. 30 tablet 0  ? VITAMIN D, ERGOCALCIFEROL, PO Take 1 tablet by mouth daily.    ? ?Current Facility-Administered Medications   ?Medication Dose Route Frequency Provider Last Rate Last Admin  ? 0.9 %  sodium chloride infusion  500 mL Intravenous Once Doran Stabler, MD      ? ? ? ? ?___________________________________________________________________ ?Objective  ? ?Exam: ? ?BP 138/83   Pulse 65   Temp 97.7 ?F (36.5 ?C)   Ht '5\' 2"'$  (1.575 m)   Wt 228 lb (103.4 kg)   LMP 12/08/2021 (Exact Date)   SpO2 98%   BMI 41.70 kg/m?  ? ?CV: RRR without murmur, S1/S2 ?Resp: clear to auscultation bilaterally, normal RR and effort noted ?GI: soft, no tenderness, with active bowel sounds. ? ? ?Assessment: ?Encounter Diagnoses  ?Name Primary?  ? Lower abdominal pain Yes  ? Heartburn   ? ? ? ?Plan: ?Colonoscopy ?EGD ? ? ?The patient is appropriate for an endoscopic procedure in the ambulatory setting. ? ? - Wilfrid Lund, MD ? ? ? ? ?

## 2021-12-16 NOTE — Progress Notes (Signed)
Called to room to assist during endoscopic procedure.  Patient ID and intended procedure confirmed with present staff. Received instructions for my participation in the procedure from the performing physician.  

## 2021-12-16 NOTE — Patient Instructions (Addendum)
Handout on polyps, diverticulosis and hemorrhoids provided  ? ?Await pathology results.  ? ?Continue current medications.  ? ?Repeat colonoscopy in 5 years for surveillance ? ?YOU HAD AN ENDOSCOPIC PROCEDURE TODAY AT [facility]:   Refer to the procedure report that was given to you for any specific questions about what was found during the examination.  If the procedure report does not answer your questions, please call your gastroenterologist to clarify.  If you requested that your care partner not be given the details of your procedure findings, then the procedure report has been included in a sealed envelope for you to review at your convenience later. ? ?YOU SHOULD EXPECT: Some feelings of bloating in the abdomen. Passage of more gas than usual.  Walking can help get rid of the air that was put into your GI tract during the procedure and reduce the bloating. If you had a lower endoscopy (such as a colonoscopy or flexible sigmoidoscopy) you may notice spotting of blood in your stool or on the toilet paper. If you underwent a bowel prep for your procedure, you may not have a normal bowel movement for a few days. ? ?Please Note:  You might notice some irritation and congestion in your nose or some drainage.  This is from the oxygen used during your procedure.  There is no need for concern and it should clear up in a day or so. ? ?SYMPTOMS TO REPORT IMMEDIATELY: ? ?Following lower endoscopy (colonoscopy or flexible sigmoidoscopy): ? Excessive amounts of blood in the stool ? Significant tenderness or worsening of abdominal pains ? Swelling of the abdomen that is new, acute ? Fever of 100?F or higher ? ?Following upper endoscopy (EGD) ? Vomiting of blood or coffee ground material ? New chest pain or pain under the shoulder blades ? Painful or persistently difficult swallowing ? New shortness of breath ? Fever of 100?F or higher ? Black, tarry-looking stools ? ?For urgent or emergent issues, a  gastroenterologist can be reached at any hour by calling [phone]. ?Do not use MyChart messaging for urgent concerns.  ? ? ?DIET:  We do recommend a small meal at first, but then you may proceed to your regular diet.  Drink plenty of fluids but you should avoid alcoholic beverages for 24 hours. ? ?ACTIVITY:  You should plan to take it easy for the rest of today and you should NOT DRIVE or use heavy machinery until tomorrow (because of the sedation medicines used during the test).   ? ?FOLLOW UP: ?Our staff will call the number listed on your records 48-72 hours following your procedure to check on you and address any questions or concerns that you may have regarding the information given to you following your procedure. If we do not reach you, we will leave a message.  We will attempt to reach you two times.  During this call, we will ask if you have developed any symptoms of COVID 19. If you develop any symptoms (ie: fever, flu-like symptoms, shortness of breath, cough etc.) before then, please call [phone].  If you test positive for Covid 19 in the 2 weeks post procedure, please call and report this information to [place].   ? ?If any biopsies were taken you will be contacted by phone or by letter within the next 1-3 weeks.  Please call us at [phone] if you have not heard about the biopsies in 3 weeks.  ? ? ?SIGNATURES/CONFIDENTIALITY: ?You and/or your care partner  have signed paperwork which will be entered into your electronic medical record.  These signatures attest to the fact that that the information above on your After Visit Summary has been reviewed and is understood.  Full responsibility of the confidentiality of this discharge information lies with you and/or your care-partner. ? ?

## 2021-12-16 NOTE — Progress Notes (Signed)
1518 Robinul 0.1 mg IV given due large amount of secretions upon assessment.  MD made aware, vss  ?

## 2021-12-16 NOTE — Op Note (Signed)
[place] ?Patient Name: Renee Avery ?Procedure Date: 12/16/2021 3:15 PM ?MRN: [id number] ?Endoscopist: Estill Cotta. Loletha Carrow , MD ?Age: 46 ?Referring MD:  ?Date of Birth: [birth year] ?Gender: Female ?Account #: [id number] ?Procedure:                Upper GI endoscopy ?Indications:              Heartburn, post-prandial chest discomfort and  ?                          bloating ?Medicines:                Monitored Anesthesia Care ?Procedure:                Pre-Anesthesia Assessment: ?                          - Prior to the procedure, a History and Physical  ?                          was performed, and patient medications and  ?                          allergies were reviewed. The patient's tolerance of  ?                          previous anesthesia was also reviewed. The risks  ?                          and benefits of the procedure and the sedation  ?                          options and risks were discussed with the patient.  ?                          All questions were answered, and informed consent  ?                          was obtained. Prior Anticoagulants: The patient has  ?                          taken no previous anticoagulant or antiplatelet  ?                          agents. ASA Grade Assessment: III - A patient with  ?                          severe systemic disease. After reviewing the risks  ?                          and benefits, the patient was deemed in  ?                          satisfactory condition to undergo the procedure. ?  After obtaining informed consent, the endoscope was  ?                          passed under direct vision. Throughout the  ?                          procedure, the patient's blood pressure, pulse, and  ?                          oxygen saturations were monitored continuously. The  ?                          GIF [id number] #[id number] was introduced through the  ?                          mouth, and advanced to the second part of duodenum.  ?                           The upper GI endoscopy was accomplished without  ?                          difficulty. The patient tolerated the procedure  ?                          well. ?Scope In: ?Scope Out: ?Findings:                 The larynx was normal. ?                          Normal mucosa was found in the entire esophagus.  ?                          Several biopsies were obtained in the lower third  ?                          of the esophagus with cold forceps for histology.  ?                          (to rule out microscopic changes of GERD) ?                          The stomach was normal. ?                          The cardia and gastric fundus were normal on  ?                          retroflexion. ?                          The examined duodenum was normal. ?Complications:            No immediate complications. ?Estimated Blood Loss:     Estimated blood loss was minimal. ?Impression:               -  Normal larynx. ?                          - Normal mucosa was found in the entire esophagus. ?                          - Normal stomach. ?                          - Normal examined duodenum. ?                          - Several biopsies were obtained in the lower third  ?                          of the esophagus. ?Recommendation:           - Patient has a contact number available for  ?                          emergencies. The signs and symptoms of potential  ?                          delayed complications were discussed with the  ?                          patient. Return to normal activities tomorrow.  ?                          Written discharge instructions were provided to the  ?                          patient. ?                          - Resume previous diet. ?                          - Continue present medications. ?                          - Await pathology results. ?                          - See the other procedure note for documentation of  ?                          additional  recommendations. ?Renee Avery L. Loletha Carrow, MD ?12/16/2021 3:56:08 PM ?This report has been signed electronically. ?

## 2021-12-17 ENCOUNTER — Telehealth: Payer: Self-pay

## 2021-12-17 NOTE — Telephone Encounter (Signed)
Thank you for the note. ? ?Yes, this patient was coughing during her procedure and required upper airway suctioning and believe anesthesia also had to apply chin lift/jaw thrust maneuver to manage her upper airway. ? ?The symptoms will resolve in the coming days, and she can definitely take ibuprofen and/or Tylenol as needed for relief. ? ?- HD ?

## 2021-12-17 NOTE — Telephone Encounter (Signed)
Left message on voice mail and relayed information from Dr Loletha Carrow'.  Instructed patient to take tylenol or ibuprofen as needed for jaw and neck discomfort.  ?

## 2021-12-17 NOTE — Telephone Encounter (Signed)
?  Follow up Call- ? ?Call back number 12/16/2021  ?Post procedure Call Back phone  # [phone]  ?Permission to leave phone message No  ?Some recent data might be hidden  ?  ? ?Patient questions: ? ?Do you have a fever, pain , or abdominal swelling? Yes.   ?Pain Score  8 * Pt. states her lip is swollen, she was told she bit her lip during and after the procedure.  States her jaw and neck are painful and rates her pain as an 8.  She states she did not take anything for pain relief as she did not know if it was allowed.  Encouraged her to take tylenol or motrin as needed.  She plans to return to work today.  She also states she threw up last night. ? ?Have you tolerated food without any problems? No. Patient states she threw up last night. ? ?Have you been able to return to your normal activities? Yes.  Plans to go to work today but states she might need to take another day off.  ? ?Do you have any questions about your discharge instructions: ?Diet   No. ?Medications  Yes.   ?Follow up visit  No. ? ?Do you have questions or concerns about your Care? Yes.   ? ?Actions: ?* If pain score is 4 or above: ?Physician/ provider Notified : Estill Cotta. Loletha Carrow, MD; Osvaldo Angst CRNA. ?Dr. Loletha Carrow please advise. I made John aware. ? ?

## 2021-12-27 ENCOUNTER — Encounter: Payer: Self-pay | Admitting: Gastroenterology

## 2022-01-11 ENCOUNTER — Ambulatory Visit: Payer: BC Managed Care – PPO | Admitting: Gastroenterology

## 2022-01-26 NOTE — Progress Notes (Addendum)
? ? ?01/27/2022 ?Renee Avery ?[id number] ?Dec 25, 1975 ? ?Referring provider: Medicine, [facility]* ?Primary GI doctor: Dr. Loletha Carrow ? ?ASSESSMENT AND PLAN:  ? ?Hemorrhoids, unspecified hemorrhoid type ?-     hydrocortisone (ANUSOL-HC) 25 MG suppository; Place 1 suppository (25 mg total) rectally 2 (two) times daily. ?-Sitz baths, increase fiber, increase water, need to fix toilet habits.  ?-Hydrocortisone supp give and external cream sent in.  ?-We discussed hemorrhoid banding here in the office or surgical treatment if hemorrhoids do not improve with conservative treatment. ?- follow up for evaluation ? ?Dyspepsia/Right upper chest pain ?-     famotidine (PEPCID) 40 MG tablet; Take 1 tablet (40 mg total) by mouth daily. ?-     US ABDOMEN LIMITED RUQ (LIVER/GB); Future ?Normal EGD, will do pepcid, weight loss advised ?Some RUQ pain, will get RUQ [place] to rule out GB ?Question some possibility of costochondritis some discomfort on chest palpation on exam, patient has been working out.  Given information about costochondritis. ?If ultrasound negative, continuing discomfort can consider further evaluation with potential pH study  ? ?Diverticulosis of colon without hemorrhage ?Will call if any symptoms. ?Add on fiber supplement, avoid NSAIDS, information given ? ?History of adenomatous polyp of colon ?Colonoscopy excellent bowel prep,  Two 2 to 4 mm adenomatous polyps, diverticulosis, internal hemorrhoids recall 5 years ? ? ?History of Present Illness:  ?46 y.o. female  with a past medical history of family history of colon cancer in mother, history of fibroids, GERD and others listed below, returns to clinic today for evaluation of GERD. ? ?Last seen by Dr. Loletha Carrow 11/09/2021 for reflux and rectal bleeding. ?12/16/2021 upper endoscopy and colonoscopy ?Endoscopy was completely normal biopsy showed reflux negative for EOE ?Colonoscopy excellent bowel prep,  Two 2 to 4 mm adenomatous polyps, diverticulosis, internal hemorrhoids  recall 5 years ?09/2016 colonoscopy (history of colon cancer in her mother): Complete exam, excellent prep, apparent diminutive polyp (normal colon tissue on pathology), 5-year recall recommended.  ? ?She continues to have reflux, not on any medications.  ?She still has some burning in her chest, it is daily, has cut out spicy/sphagetti.  ?She denies dysphagia, melena, AB pain.  ?No chest pain, no dyspnea with exertion. She denies palpitations.  ?  ?She reports NSAID use but has not needed any in 3 weeks.  ?She reports ETOH use once a week. Marland Kitchen   ?She denies family history of gallbladder issues.  ? ? ?Current Medications:  ? ? ? ?Current Outpatient Medications (Respiratory):  ?  levocetirizine (XYZAL) 5 MG tablet, Take 1 tablet (5 mg total) by mouth every evening. ? ?Current Outpatient Medications (Analgesics):  ?  acetaminophen (TYLENOL) 500 MG tablet, Take by mouth. ?  ibuprofen (ADVIL) 200 MG tablet, Take 600-800 mg by mouth every 6 (six) hours as needed for moderate pain or cramping. ?  meloxicam (MOBIC) 7.5 MG tablet, Take 1 tablet (7.5 mg total) by mouth daily. ?  naproxen (NAPROSYN) 500 MG tablet, Take by mouth. ? ?Current Outpatient Medications (Hematological):  ?  ferrous sulfate 325 (65 FE) MG EC tablet, Take 325 mg by mouth daily.  ? ?Current Outpatient Medications (Other):  ?  cyclobenzaprine (FLEXERIL) 5 MG tablet, Take 1 tablet (5 mg total) by mouth 3 (three) times daily as needed for muscle spasms. ?  famotidine (PEPCID) 40 MG tablet, Take 1 tablet (40 mg total) by mouth daily. ?  hydrocortisone (ANUSOL-HC) 25 MG suppository, Place 1 suppository (25 mg total) rectally 2 (two) times daily. ?  Multiple Vitamin (MULTIVITAMIN) capsule, Take 1 capsule by mouth daily. ?  triamcinolone cream (KENALOG) 0.1 %, Apply topically. ?  VITAMIN D, ERGOCALCIFEROL, PO, Take 1 tablet by mouth daily. ? ?Surgical History:  ?She  has a past surgical history that includes uterine biopsy. ?Family History:  ?Her family history  includes Asthma in her sister; Colon cancer in her mother; Colon polyps in her mother; Eczema in her sister and sister; Food Allergy in her sister; Heart attack in her father. ?Social History:  ? reports that she has quit smoking. Her smoking use included cigarettes. She has never used smokeless tobacco. She reports current alcohol use. She reports that she does not use drugs. ? ?Current Medications, Allergies, Past Medical History, Past Surgical History, Family History and Social History were reviewed in [employer] record. ? ?Physical Exam: ?BP 100/60   Pulse 78   Ht 5' 2.5" (1.588 m)   Wt 229 lb (103.9 kg)   BMI 41.22 kg/m?  ?General:   Pleasant, well developed female in no acute distress ?Heart:  Regular rate and rhythm; with systolic murmur best heard LSB, mild tenderness left upper chest wall. ?Pulm: Clear anteriorly; no wheezing ?Abdomen: Soft, obese AB, normal skin exam, active bowel sounds, tenderness RUQ, without murphy's sign. No hepatomegaly appreciated.  ?Extremities:  without  edema. ?Neurologic:  Alert and  oriented x4;  No focal deficits.  ?Psych:  Cooperative. Normal mood and affect. ? ? ?Vladimir Crofts, PA-C ?01/27/22 ?

## 2022-01-27 ENCOUNTER — Ambulatory Visit (INDEPENDENT_AMBULATORY_CARE_PROVIDER_SITE_OTHER): Payer: BC Managed Care – PPO | Admitting: Physician Assistant

## 2022-01-27 ENCOUNTER — Encounter: Payer: Self-pay | Admitting: *Deleted

## 2022-01-27 ENCOUNTER — Encounter: Payer: Self-pay | Admitting: Physician Assistant

## 2022-01-27 VITALS — BP 100/60 | HR 78 | Ht 62.5 in | Wt 229.0 lb

## 2022-01-27 DIAGNOSIS — K649 Unspecified hemorrhoids: Secondary | ICD-10-CM | POA: Diagnosis not present

## 2022-01-27 DIAGNOSIS — R1013 Epigastric pain: Secondary | ICD-10-CM | POA: Diagnosis not present

## 2022-01-27 DIAGNOSIS — Z86010 Personal history of colonic polyps: Secondary | ICD-10-CM

## 2022-01-27 DIAGNOSIS — K5730 Diverticulosis of large intestine without perforation or abscess without bleeding: Secondary | ICD-10-CM

## 2022-01-27 MED ORDER — HYDROCORTISONE ACETATE 25 MG RE SUPP: 25.0000 mg | Freq: Two times a day (BID) | RECTAL | 0 refills | Status: DC

## 2022-01-27 MED ORDER — FAMOTIDINE 40 MG PO TABS: 40.0000 mg | ORAL_TABLET | Freq: Every day | ORAL | 0 refills | Status: DC

## 2022-01-27 NOTE — Patient Instructions (Signed)
ADD ON FAMOTIDINE DAILY FOR 1 MONTH THEN AS NEEDED.  ?Avoid spicy and acidic foods ?Avoid fatty foods ?Limit your intake of coffee, tea, alcohol, and carbonated drinks ?Work to maintain a healthy weight ?Keep the head of the bed elevated at least 3 inches with blocks or a wedge pillow if you are having any nighttime symptoms ?Stay upright for 2 hours after eating ?Avoid meals and snacks three to four hours before bedtime ? ?3 MONTH FOLLOW UP, YOU WILL NEED TO CALL THE OFFICE BACK TO SCHEDULE THAT APPOINTMENT ? ?Costochondritis ? ?Costochondritis is inflammation of the tissue (cartilage) that connects the ribs to the breastbone (sternum). This causes pain in the front of the chest. The pain usually starts slowly and involves more than one rib. ?What are the causes? ?The exact cause of this condition is not always known. It results from stress on the cartilage where your ribs attach to your sternum. The cause of this stress could be: ?Chest injury. ?Exercise or activity, such as lifting. ?Severe coughing. ?What increases the risk? ?You are more likely to develop this condition if you: ?Are female. ?Are 60-66 years old. ?Recently started a new exercise or work activity. ?Have low levels of vitamin D. ?Have a condition that makes you cough frequently. ?What are the signs or symptoms? ?The main symptom of this condition is chest pain. The pain: ?Usually starts gradually and can be sharp or dull. ?Gets worse with deep breathing, coughing, or exercise. ?Gets better with rest. ?May be worse when you press on the affected area of your ribs and sternum. ?How is this diagnosed? ?This condition is diagnosed based on your symptoms, your medical history, and a physical exam. Your health care provider will check for pain when pressing on your sternum. You may also have tests to rule out other causes of chest pain. These may include: ?A chest X-ray to check for lung problems. ?An ECG (electrocardiogram) to see if you have a heart  problem that could be causing the pain. ?An imaging scan to rule out a chest or rib fracture. ?How is this treated? ?This condition usually goes away on its own over time. Your health care provider may prescribe an NSAID, such as ibuprofen, to reduce pain and inflammation. Treatment may also include: ?Resting and avoiding activities that make pain worse. ?Applying heat or ice to the area to reduce pain and inflammation. ?Doing exercises to stretch your chest muscles. ?If these treatments do not help, your health care provider may inject a numbing medicine at the sternum-rib connection to help relieve the pain. ?Follow these instructions at home: ?Managing pain, stiffness, and swelling ? ?  ? ?If directed, put ice on the painful area. To do this: ?Put ice in a plastic bag. ?Place a towel between your skin and the bag. ?Leave the ice on for 20 minutes, 2-3 times a day. ?If directed, apply heat to the affected area as often as told by your health care provider. Use the heat source that your health care provider recommends, such as a moist heat pack or a heating pad. ?Place a towel between your skin and the heat source. ?Leave the heat on for 20-30 minutes. ?Remove the heat if your skin turns bright red. This is especially important if you are unable to feel pain, heat, or cold. You may have a greater risk of getting burned. ?Activity ?Rest as told by your health care provider. Avoid activities that make pain worse. This includes any activities that use chest,  abdominal, and side muscles. ?Do not lift anything that is heavier than 10 lb (4.5 kg), or the limit that you are told, until your health care provider says that it is safe. ?Return to your normal activities as told by your health care provider. Ask your health care provider what activities are safe for you. ?General instructions ?Take over-the-counter and prescription medicines only as told by your health care provider. ?Keep all follow-up visits as told by your  health care provider. This is important. ?Contact a health care provider if: ?You have chills or a fever. ?Your pain does not go away or it gets worse. ?You have a cough that does not go away. ?Get help right away if: ?You have shortness of breath. ?You have severe chest pain that is not relieved by medicines, heat, or ice. ?These symptoms may represent a serious problem that is an emergency. Do not wait to see if the symptoms will go away. Get medical help right away. Call your local emergency services (911 in the [place] Do not drive yourself to the hospital.  ?Summary ?Costochondritis is inflammation of the tissue (cartilage) that connects the ribs to the breastbone (sternum). ?This condition causes pain in the front of the chest. ?Costochondritis results from stress on the cartilage where your ribs attach to your sternum. ?Treatment may include medicines, rest, heat or ice, and exercises. ?This information is not intended to replace advice given to you by your health care provider. Make sure you discuss any questions you have with your health care provider. ?Document Revised: 08/09/2019 Document Reviewed: 08/09/2019 ?Elsevier Patient Education ? [place]. ? ? ?Diverticulosis ?Diverticulosis is a condition that develops when small pouches (diverticula) form in the wall of the large intestine (colon). The colon is where water is absorbed and stool (feces) is formed. The pouches form when the inside layer of the colon pushes through weak spots in the outer layers of the colon. You may have a few pouches or many of them. ?The pouches usually do not cause problems unless they become inflamed or infected. When this happens, the condition is called diverticulitis- this is left lower quadrant pain, diarrhea, fever, chills, nausea or vomiting.  If this occurs please call the office or go to the hospital. ?Sometimes these patches without inflammation can also have painless bleeding associated with them, if this  happens please call the office or go to the hospital. ?Preventing constipation and increasing fiber can help reduce diverticula and prevent complications. ?Even if you feel you have a high-fiber diet, suggest getting on Benefiber 2-3 times daily. ?What increases the risk? ?The following factors may make you more likely to develop this condition: ?Being older than age 27. Your risk for this condition increases with age. Diverticulosis is rare among people younger than age 30. By age 39, many people have it. ?Eating a low-fiber diet. ?Having frequent constipation. ?Being overweight. ?Not getting enough exercise. ?Smoking. ?Taking over-the-counter pain medicines, like aspirin and ibuprofen.- can increase a flare.  ?Having a family history of diverticulosis. ?How is this diagnosed? ?Because diverticulosis usually has no symptoms, it is most often diagnosed during an exam for other colon problems. The condition may be diagnosed by: ?Using a flexible scope to examine the colon (colonoscopy). ?Taking an X-ray of the colon after dye has been put into the colon (barium enema). ?Having a CT scan. ?How is this treated? ?You may not need treatment for this condition. Your health care provider may recommend treatment to prevent problems. You  may need treatment if you have symptoms or if you previously had diverticulitis. Treatment may include: ?Eating a high-fiber diet. ?Taking a fiber supplement. ?Taking a live bacteria supplement (probiotic). ?Taking medicine to relax your colon. ?Contact a health care provider if you: ?Have pain in your abdomen. ?Have bloating. ?Have cramps. ?Have not had a bowel movement in 3 days. ?Get help right away if: ?Your pain gets worse. ?Your bloating becomes very bad. ?You have a fever or chills, and your symptoms suddenly get worse. ?You vomit. ?You have bowel movements that are bloody or black. ?You have bleeding from your rectum. ?Summary ?Diverticulosis is a condition that develops when small  pouches (diverticula) form in the wall of the large intestine (colon). ?You may have a few pouches or many of them. ?This condition is most often diagnosed during an exam for other colon problems. ?Treatm

## 2022-01-27 NOTE — Progress Notes (Signed)
____________________________________________________________  Attending physician addendum:  Thank you for sending this case to me. I have reviewed the entire note and agree with the plan.   Nasiya Pascual Danis, MD  ____________________________________________________________  

## 2022-02-01 ENCOUNTER — Ambulatory Visit (HOSPITAL_BASED_OUTPATIENT_CLINIC_OR_DEPARTMENT_OTHER)
Admission: RE | Admit: 2022-02-01 | Discharge: 2022-02-01 | Disposition: A | Discharge status: Home / Self Care | Payer: BC Managed Care – PPO | Source: Ambulatory Visit | Attending: Physician Assistant | Admitting: Physician Assistant

## 2022-02-01 DIAGNOSIS — R1013 Epigastric pain: Secondary | ICD-10-CM | POA: Insufficient documentation

## 2022-03-26 ENCOUNTER — Emergency Department (HOSPITAL_COMMUNITY)
Admission: EM | Admit: 2022-03-26 | Discharge: 2022-03-27 | Disposition: A | Discharge status: Home / Self Care | Payer: BC Managed Care – PPO | Attending: Emergency Medicine | Admitting: Emergency Medicine

## 2022-03-26 ENCOUNTER — Emergency Department (HOSPITAL_COMMUNITY): Payer: BC Managed Care – PPO

## 2022-03-26 ENCOUNTER — Other Ambulatory Visit: Payer: Self-pay

## 2022-03-26 ENCOUNTER — Encounter (HOSPITAL_COMMUNITY): Payer: Self-pay

## 2022-03-26 DIAGNOSIS — Z87891 Personal history of nicotine dependence: Secondary | ICD-10-CM | POA: Insufficient documentation

## 2022-03-26 DIAGNOSIS — D649 Anemia, unspecified: Secondary | ICD-10-CM | POA: Diagnosis not present

## 2022-03-26 DIAGNOSIS — R079 Chest pain, unspecified: Secondary | ICD-10-CM | POA: Diagnosis present

## 2022-03-26 DIAGNOSIS — R072 Precordial pain: Secondary | ICD-10-CM | POA: Diagnosis not present

## 2022-03-26 LAB — CBC
HCT: 38.7 % (ref 36.0–46.0)
Hemoglobin: 11.6 g/dL — ABNORMAL LOW (ref 12.0–15.0)
MCH: 21.1 pg — ABNORMAL LOW (ref 26.0–34.0)
MCHC: 30 g/dL (ref 30.0–36.0)
MCV: 70.2 fL — ABNORMAL LOW (ref 80.0–100.0)
Platelets: 278 10*3/uL (ref 150–400)
RBC: 5.51 MIL/uL — ABNORMAL HIGH (ref 3.87–5.11)
RDW: 17.5 % — ABNORMAL HIGH (ref 11.5–15.5)
WBC: 7.3 10*3/uL (ref 4.0–10.5)
nRBC: 0 % (ref 0.0–0.2)

## 2022-03-26 LAB — BASIC METABOLIC PANEL
Anion gap: 8 (ref 5–15)
BUN: 13 mg/dL (ref 6–20)
CO2: 26 mmol/L (ref 22–32)
Calcium: 8.5 mg/dL — ABNORMAL LOW (ref 8.9–10.3)
Chloride: 107 mmol/L (ref 98–111)
Creatinine, Ser: 0.79 mg/dL (ref 0.44–1.00)
GFR, Estimated: >60 mL/min (ref >60)
Glucose, Bld: 93 mg/dL (ref 70–99)
Potassium: 3.5 mmol/L (ref 3.5–5.1)
Sodium: 141 mmol/L (ref 135–145)

## 2022-03-26 LAB — TROPONIN I (HIGH SENSITIVITY): Troponin I (High Sensitivity): 5 ng/L (ref <18)

## 2022-03-26 MED ORDER — ALUM & MAG HYDROXIDE-SIMETH 200-200-20 MG/5ML PO SUSP
15.0000 mL | Freq: Once | ORAL | Status: AC
  Administered 2022-03-26: 15 mL via ORAL
  Filled 2022-03-26: qty 30

## 2022-03-26 NOTE — ED Provider Notes (Signed)
Emergency Department Provider Note   I have reviewed the triage vital signs and the nursing notes.   HISTORY  Chief Complaint Chest Pain   HPI Tayona Sarnowski is a 46 y.o. female with past medical history reviewed below presents emergency department for evaluation of central chest heaviness and burning pain.  Symptoms began after eating chili for tenderness been persistent.  There is no pleuritic or exertional component to pain.  No diaphoresis.  No nausea or vomiting.  No prior history of ACS or PE.  She took her famotidine but had no relief in symptoms.   Past Medical History:  Diagnosis Date   Allergic urticaria    Allergy    bug bite; hives   Anemia    DDD (degenerative disc disease), cervical    Eczema    Hemorrhoid    Obesity    Prediabetes    Spondylolysis of cervical region    Vitamin D deficiency     Review of Systems  Constitutional: No fever/chills Eyes: No visual changes. ENT: No sore throat. Cardiovascular: Positive chest pain. Respiratory: Denies shortness of breath. Gastrointestinal: No abdominal pain.  No nausea, no vomiting.  No diarrhea.  No constipation. Genitourinary: Negative for dysuria. Musculoskeletal: Negative for back pain. Skin: Negative for rash. Neurological: Negative for headaches, focal weakness or numbness.  ____________________________________________   PHYSICAL EXAM:  VITAL SIGNS: ED Triage Vitals  Enc Vitals Group     BP 03/26/22 2046 (!) 152/104     Pulse Rate 03/26/22 2046 73     Resp 03/26/22 2046 20     Temp 03/26/22 2046 98.1 F (36.7 C)     Temp Source 03/26/22 2046 Oral     SpO2 03/26/22 2046 98 %     Weight 03/26/22 2045 225 lb (102.1 kg)     Height 03/26/22 2045 5' 2.5" (1.588 m)   Constitutional: Alert and oriented. Well appearing and in no acute distress. Eyes: Conjunctivae are normal.  Head: Atraumatic. Nose: No congestion/rhinnorhea. Mouth/Throat: Mucous membranes are moist.   Neck: No stridor.   Cardiovascular: Normal rate, regular rhythm. Good peripheral circulation. Grossly normal heart sounds.   Respiratory: Normal respiratory effort.  No retractions. Lungs CTAB. Gastrointestinal: Soft and nontender. No distention. Musculoskeletal: No lower extremity tenderness nor edema. No gross deformities of extremities. Neurologic:  Normal speech and language. No gross focal neurologic deficits are appreciated.  Skin:  Skin is warm, dry and intact. No rash noted.  ____________________________________________   LABS (all labs ordered are listed, but only abnormal results are displayed)  Labs Reviewed  BASIC METABOLIC PANEL - Abnormal; Notable for the following components:      Result Value   Calcium 8.5 (*)    All other components within normal limits  CBC - Abnormal; Notable for the following components:   RBC 5.51 (*)    Hemoglobin 11.6 (*)    MCV 70.2 (*)    MCH 21.1 (*)    RDW 17.5 (*)    All other components within normal limits  HEPATIC FUNCTION PANEL - Abnormal; Notable for the following components:   Bilirubin, Direct 0.5 (*)    All other components within normal limits  LIPASE, BLOOD  TROPONIN I (HIGH SENSITIVITY)  TROPONIN I (HIGH SENSITIVITY)   ____________________________________________  EKG   EKG Interpretation  Date/Time:  Saturday March 26 2022 20:48:08 EDT Ventricular Rate:  66 PR Interval:  160 QRS Duration: 106 QT Interval:  426 QTC Calculation: 447 R Axis:   11 Text  Interpretation: Sinus rhythm Similar to prior Confirmed by Nanda Quinton [phone]) on 03/26/2022 11:02:07 PM        ____________________________________________  RADIOLOGY  DG Chest 2 View  Result Date: 03/26/2022 CLINICAL DATA:  Chest pain. EXAM: CHEST - 2 VIEW COMPARISON:  Chest radiograph dated 03/21/2019. FINDINGS: The heart size and mediastinal contours are within normal limits. Both lungs are clear. The visualized skeletal structures are unremarkable. IMPRESSION: No active  cardiopulmonary disease. Electronically Signed   By: Anner Crete M.D.   On: 03/26/2022 21:36    ____________________________________________   PROCEDURES  Procedure(s) performed:   Procedures   ____________________________________________   INITIAL IMPRESSION / ASSESSMENT AND PLAN / ED COURSE  Pertinent labs & imaging results that were available during my care of the patient were reviewed by me and considered in my medical decision making (see chart for details).   This patient is Presenting for Evaluation of CP, which does require a range of treatment options, and is a complaint that involves a high risk of morbidity and mortality.  The Differential Diagnoses includes but is not exclusive to acute coronary syndrome, aortic dissection, pulmonary embolism, cardiac tamponade, community-acquired pneumonia, pericarditis, musculoskeletal chest wall pain, etc.   Critical Interventions-    Medications  alum & mag hydroxide-simeth (MAALOX/MYLANTA) 200-200-20 MG/5ML suspension 15 mL (15 mLs Oral Given 03/26/22 2339)    Reassessment after intervention:  Symptoms improved.   I decided to review pertinent External Data, and in summary no left heart cath previously.   Clinical Laboratory Tests Ordered, included no acute kidney injury.  Troponin negative.  No leukocytosis.  Mild anemia at 11.6  Radiologic Tests Ordered, included CXR. I independently interpreted the images and agree with radiology interpretation.   Cardiac Monitor Tracing which shows NSR.    Social Determinants of Health Risk patient is a former smoker.    Medical Decision Making: Summary:  Patient presents the emergency department for evaluation of chest discomfort.  Lower suspicion for ACS clinically.  EKG is reassuring and similar to prior values.  Initial troponin is negative.  Have added on LFTs, lipase, second troponin and will reassess.  Reevaluation with update and discussion with patient. Second troponin  and LFTs are reassuring. Plan for d/c with Protonix and close PCP follow up.   Considered admission but ACS workup is reassuring. Plan for close PCP follow up.   Disposition: discharge  ____________________________________________  FINAL CLINICAL IMPRESSION(S) / ED DIAGNOSES  Final diagnoses:  Precordial chest pain     NEW OUTPATIENT MEDICATIONS STARTED DURING THIS VISIT:  New Prescriptions   PANTOPRAZOLE (PROTONIX) 40 MG TABLET    Take 1 tablet (40 mg total) by mouth daily.    Note:  This document was prepared using Dragon voice recognition software and may include unintentional dictation errors.  Nanda Quinton, MD, [facility]    Nonnie Pickney, Wonda Olds, MD 03/27/22 [phone]

## 2022-03-26 NOTE — ED Triage Notes (Addendum)
Patient said an hour ago her chest started hurting. She feels like she needs to burp. Could not find her antacid medication. Said pain is centralized and moves to the left side of her back. Stated she ate some hot chili for dinner.

## 2022-03-27 LAB — HEPATIC FUNCTION PANEL
ALT: 10 U/L (ref 0–44)
AST: 40 U/L (ref 15–41)
Albumin: 3.7 g/dL (ref 3.5–5.0)
Alkaline Phosphatase: 54 U/L (ref 38–126)
Bilirubin, Direct: 0.5 mg/dL — ABNORMAL HIGH (ref 0.0–0.2)
Indirect Bilirubin: 0.3 mg/dL (ref 0.3–0.9)
Total Bilirubin: 0.8 mg/dL (ref 0.3–1.2)
Total Protein: 7.5 g/dL (ref 6.5–8.1)

## 2022-03-27 LAB — TROPONIN I (HIGH SENSITIVITY): Troponin I (High Sensitivity): 5 ng/L (ref <18)

## 2022-03-27 LAB — LIPASE, BLOOD: Lipase: 27 U/L (ref 11–51)

## 2022-03-27 MED ORDER — PANTOPRAZOLE SODIUM 40 MG PO TBEC: 40.0000 mg | DELAYED_RELEASE_TABLET | Freq: Every day | ORAL | 0 refills | Status: DC

## 2022-03-27 NOTE — Discharge Instructions (Signed)

## 2022-06-12 ENCOUNTER — Emergency Department (HOSPITAL_COMMUNITY): Payer: BC Managed Care – PPO

## 2022-06-12 ENCOUNTER — Encounter (HOSPITAL_COMMUNITY): Payer: Self-pay | Admitting: Emergency Medicine

## 2022-06-12 ENCOUNTER — Other Ambulatory Visit: Payer: Self-pay

## 2022-06-12 ENCOUNTER — Emergency Department (HOSPITAL_COMMUNITY)
Admission: EM | Admit: 2022-06-12 | Discharge: 2022-06-12 | Disposition: A | Discharge status: Home / Self Care | Payer: BC Managed Care – PPO | Attending: Emergency Medicine | Admitting: Emergency Medicine

## 2022-06-12 DIAGNOSIS — E876 Hypokalemia: Secondary | ICD-10-CM | POA: Diagnosis not present

## 2022-06-12 DIAGNOSIS — R079 Chest pain, unspecified: Secondary | ICD-10-CM | POA: Diagnosis present

## 2022-06-12 DIAGNOSIS — K219 Gastro-esophageal reflux disease without esophagitis: Secondary | ICD-10-CM | POA: Insufficient documentation

## 2022-06-12 LAB — TROPONIN I (HIGH SENSITIVITY)
Troponin I (High Sensitivity): 9 ng/L (ref <18)
Troponin I (High Sensitivity): 7 ng/L (ref <18)

## 2022-06-12 LAB — CBC
HCT: 38.1 % (ref 36.0–46.0)
Hemoglobin: 12 g/dL (ref 12.0–15.0)
MCH: 22.1 pg — ABNORMAL LOW (ref 26.0–34.0)
MCHC: 31.5 g/dL (ref 30.0–36.0)
MCV: 70.2 fL — ABNORMAL LOW (ref 80.0–100.0)
Platelets: 280 10*3/uL (ref 150–400)
RBC: 5.43 MIL/uL — ABNORMAL HIGH (ref 3.87–5.11)
RDW: 15.2 % (ref 11.5–15.5)
WBC: 7.9 10*3/uL (ref 4.0–10.5)
nRBC: 0 % (ref 0.0–0.2)

## 2022-06-12 LAB — BASIC METABOLIC PANEL
Anion gap: 8 (ref 5–15)
BUN: 9 mg/dL (ref 6–20)
CO2: 24 mmol/L (ref 22–32)
Calcium: 8.6 mg/dL — ABNORMAL LOW (ref 8.9–10.3)
Chloride: 102 mmol/L (ref 98–111)
Creatinine, Ser: 0.8 mg/dL (ref 0.44–1.00)
GFR, Estimated: >60 mL/min (ref >60)
Glucose, Bld: 111 mg/dL — ABNORMAL HIGH (ref 70–99)
Potassium: 3.3 mmol/L — ABNORMAL LOW (ref 3.5–5.1)
Sodium: 134 mmol/L — ABNORMAL LOW (ref 135–145)

## 2022-06-12 LAB — HEPATIC FUNCTION PANEL
ALT: 9 U/L (ref 0–44)
AST: 18 U/L (ref 15–41)
Albumin: 3.9 g/dL (ref 3.5–5.0)
Alkaline Phosphatase: 45 U/L (ref 38–126)
Bilirubin, Direct: <0.1 mg/dL (ref 0.0–0.2)
Total Bilirubin: 0.2 mg/dL — ABNORMAL LOW (ref 0.3–1.2)
Total Protein: 7.4 g/dL (ref 6.5–8.1)

## 2022-06-12 LAB — I-STAT BETA HCG BLOOD, ED (MC, WL, AP ONLY): I-stat hCG, quantitative: <5 m[IU]/mL (ref <5)

## 2022-06-12 LAB — D-DIMER, QUANTITATIVE: D-Dimer, Quant: 0.33 ug/mL-FEU (ref 0.00–0.50)

## 2022-06-12 MED ORDER — LACTATED RINGERS IV BOLUS: 1000.0000 mL | Freq: Once | INTRAVENOUS | Status: DC

## 2022-06-12 MED ORDER — LIDOCAINE VISCOUS HCL 2 % MT SOLN
15.0000 mL | Freq: Once | OROMUCOSAL | Status: AC
  Administered 2022-06-12: 15 mL via ORAL
  Filled 2022-06-12: qty 15

## 2022-06-12 MED ORDER — ALUM & MAG HYDROXIDE-SIMETH 200-200-20 MG/5ML PO SUSP
30.0000 mL | Freq: Once | ORAL | Status: AC
  Administered 2022-06-12: 30 mL via ORAL
  Filled 2022-06-12: qty 30

## 2022-06-12 MED ORDER — FAMOTIDINE 20 MG PO TABS
20.0000 mg | ORAL_TABLET | Freq: Once | ORAL | Status: AC
  Administered 2022-06-12: 20 mg via ORAL
  Filled 2022-06-12: qty 1

## 2022-06-12 MED ORDER — FAMOTIDINE IN NACL 20-0.9 MG/50ML-% IV SOLN
20.0000 mg | Freq: Once | INTRAVENOUS | Status: DC
  Filled 2022-06-12: qty 50

## 2022-06-12 NOTE — ED Notes (Signed)
Patient verbalizes understanding of discharge instructions. Opportunity for questioning and answers were provided. Armband removed by staff, pt discharged from ED. Pt ambulatory to ED waiting room with steady gait.  

## 2022-06-12 NOTE — ED Triage Notes (Signed)
Pt BIB EMS from home pt was cleaning house when she started having central chest pressure in her chest and neck non radiating. Hx of acid reflux. Denies ShOB. 324 ASA and 0.4 nitro and '4mg'$  zofran given by EMS 250 NS due to hypotension following nitro.   EMS vitals: 86/46 HR 110 RR 24 99% RA

## 2022-06-12 NOTE — ED Provider Notes (Signed)
[place] EMERGENCY DEPARTMENT Provider Note   CSN: [id number] Arrival date & time: 06/12/22  1655     History {Add pertinent medical, surgical, social history, OB history to HPI:1} Chief Complaint  Patient presents with   Chest Pain    Renee Avery is a 46 y.o. female.   Chest Pain      Home Medications Prior to Admission medications   Medication Sig Start Date End Date Taking? Authorizing Provider  cetirizine (ZYRTEC) 10 MG tablet Take 10 mg by mouth daily.    [provider]  cyclobenzaprine (FLEXERIL) 5 MG tablet Take 1 tablet (5 mg total) by mouth 3 (three) times daily as needed for muscle spasms. Patient not taking: Reported on 03/27/2022 10/23/17   Jaynee Eagles, PA-C  famotidine (PEPCID) 40 MG tablet Take 1 tablet (40 mg total) by mouth daily. 01/27/22 04/27/22  Vladimir Crofts, PA-C  ferrous sulfate 325 (65 FE) MG EC tablet Take 325 mg by mouth daily.     [provider]  hydrochlorothiazide (HYDRODIURIL) 12.5 MG tablet Take 12.5 mg by mouth daily. 03/03/22   [provider]  hydrocortisone (ANUSOL-HC) 25 MG suppository Place 1 suppository (25 mg total) rectally 2 (two) times daily. Patient not taking: Reported on 03/27/2022 01/27/22   Vladimir Crofts, PA-C  levocetirizine (XYZAL) 5 MG tablet Take 1 tablet (5 mg total) by mouth every evening. Patient not taking: Reported on 03/27/2022 11/07/16   Bobbitt, Sedalia Muta, MD  meloxicam (MOBIC) 15 MG tablet Take 15 mg by mouth daily. Patient not taking: Reported on 03/27/2022 03/23/22   [provider]  meloxicam (MOBIC) 7.5 MG tablet Take 1 tablet (7.5 mg total) by mouth daily. Patient not taking: Reported on 03/27/2022 10/23/17   Jaynee Eagles, PA-C  Multiple Vitamin (MULTIVITAMIN) capsule Take 1 capsule by mouth daily.    [provider]  pantoprazole (PROTONIX) 40 MG tablet Take 1 tablet (40 mg total) by mouth daily. 03/27/22   Long, Wonda Olds, MD  predniSONE  (DELTASONE) 20 MG tablet Take by mouth. Patient not taking: Reported on 03/27/2022 03/09/22   [provider]  VITAMIN D, ERGOCALCIFEROL, PO Take 1 tablet by mouth daily.    [provider]      Allergies    Other    Review of Systems   Review of Systems  Cardiovascular:  Positive for chest pain.    Physical Exam Updated Vital Signs BP (!) 145/83   Pulse 92   Temp 99.2 F (37.3 C) (Oral)   Resp 19   Ht 5' 2.5" (1.588 m)   Wt 99.8 kg   SpO2 100%   BMI 39.60 kg/m  Physical Exam  ED Results / Procedures / Treatments   Labs (all labs ordered are listed, but only abnormal results are displayed) Labs Reviewed - No data to display  EKG None  Radiology No results found.  Procedures Procedures  {Document cardiac monitor, telemetry assessment procedure when appropriate:1}  Medications Ordered in ED Medications - No data to display  ED Course/ Medical Decision Making/ A&P                           Medical Decision Making  ***  {Document critical care time when appropriate:1} {Document review of labs and clinical decision tools ie heart score, Chads2Vasc2 etc:1}  {Document your independent review of radiology images, and any outside records:1} {Document your discussion with family members, caretakers, and with  consultants:1} {Document social determinants of health affecting pt's care:1} {Document your decision making why or why not admission, treatments were needed:1} Final Clinical Impression(s) / ED Diagnoses Final diagnoses:  None    Rx / DC Orders ED Discharge Orders     None

## 2022-06-12 NOTE — Discharge Instructions (Signed)
Your cardiac enzymes were normal.  Your symptoms are very consistent with gastroesophageal reflux.  Your symptoms improved following Maalox and lidocaine and a Pepcid tablet.  Recommend you continue your outpatient regimen of Pepcid and Protonix.  Take Maalox as needed over-the-counter.  Follow-up with your PCP.

## 2022-06-12 NOTE — ED Notes (Signed)
When pt arrived to ED she was upset d/t seeing an air bubble in IV. RN reassured the air bubble was tiny and alerted EDP of pt concerns.   Pt requested for IV to be removed.  Pt originally agreed to have an IV placed but once RN explained the reason for IV placement and lab work pt decline and requested oral medication only. Pt states she may be having anxiety.  RN explained we will do work up one step at a time. Pt agreed to straight stick for lab work.

## 2023-03-03 IMAGING — US US ABDOMEN LIMITED
1 series · 14 of 25 positions shown · non-contrast
Comparison: None.

CLINICAL DATA: Elevated LFTs

EXAM:
ULTRASOUND ABDOMEN LIMITED RIGHT UPPER QUADRANT

[Series 1: us abdomen limited ruq (liver/gb) · 14 of 36 slices shown]
[im 1/36]
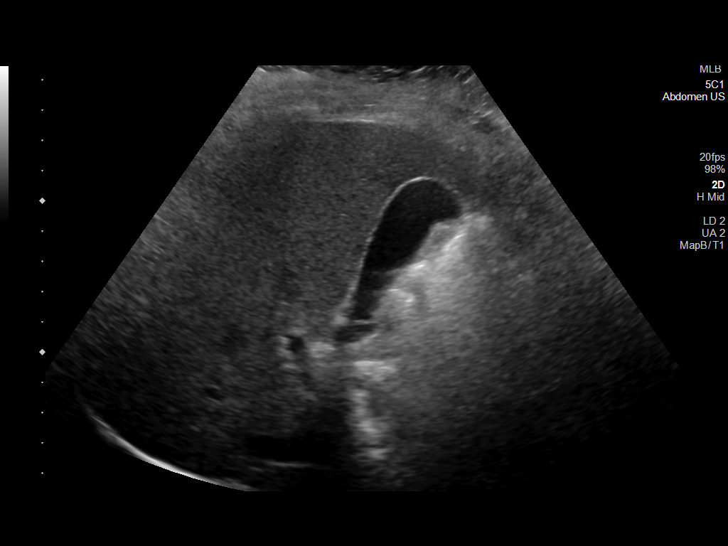
[im 3/36]
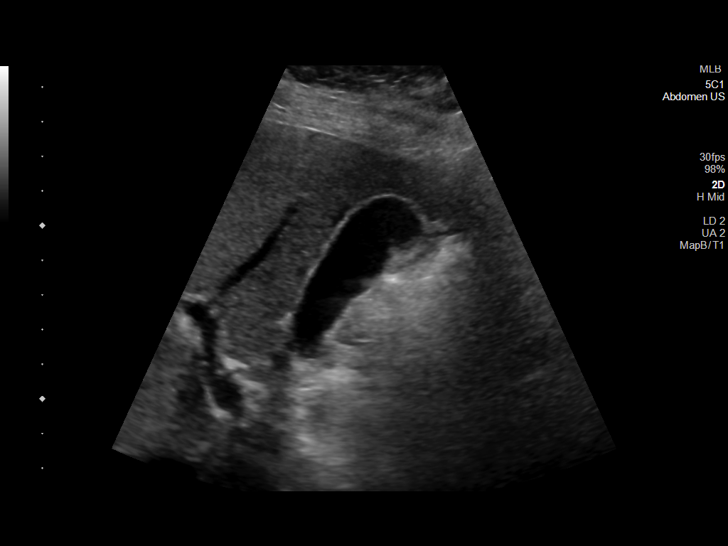
[im 6/36]
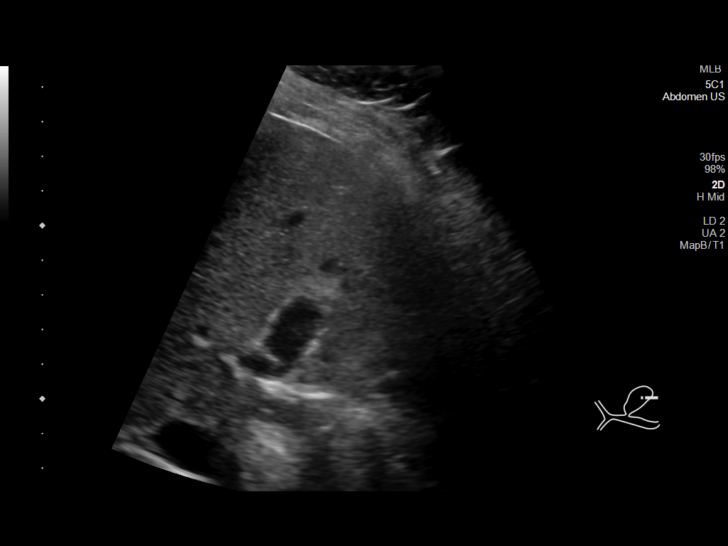
[im 9/36]
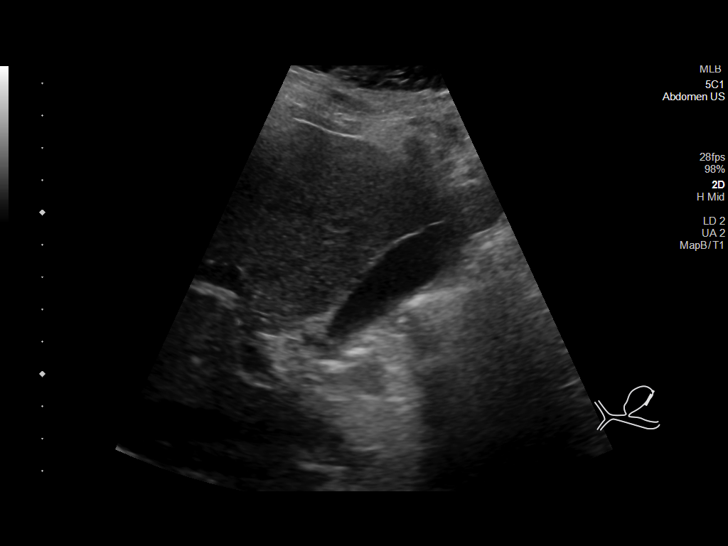
[im 12/36]
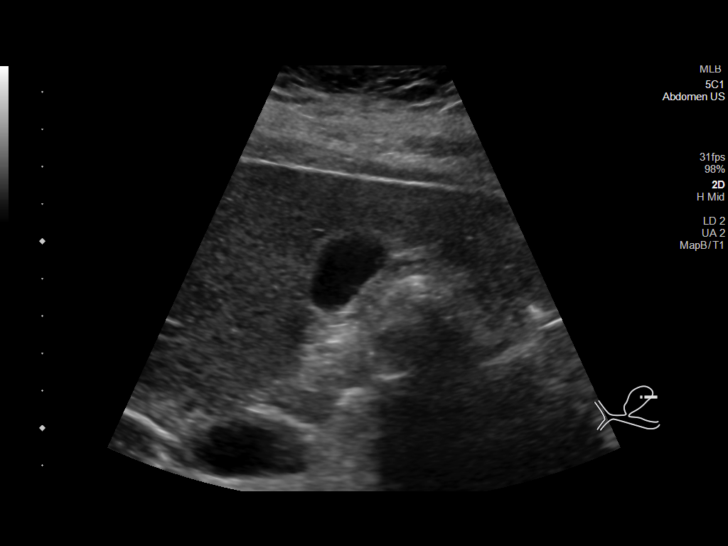
[im 14/36]
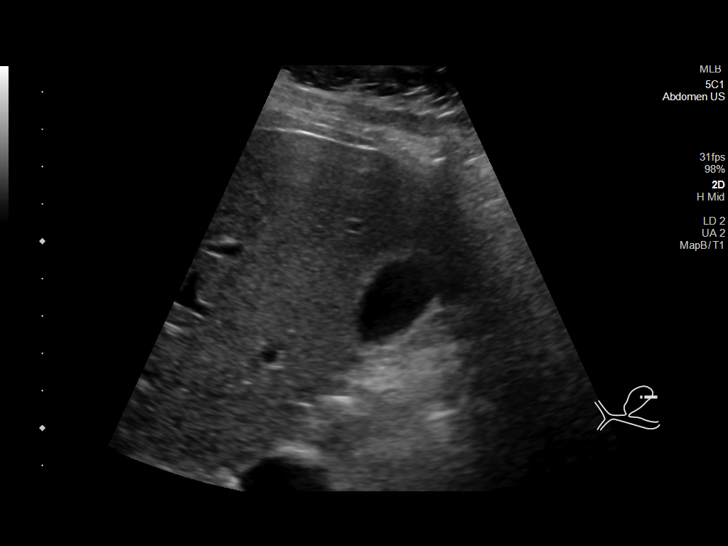
[im 17/36]
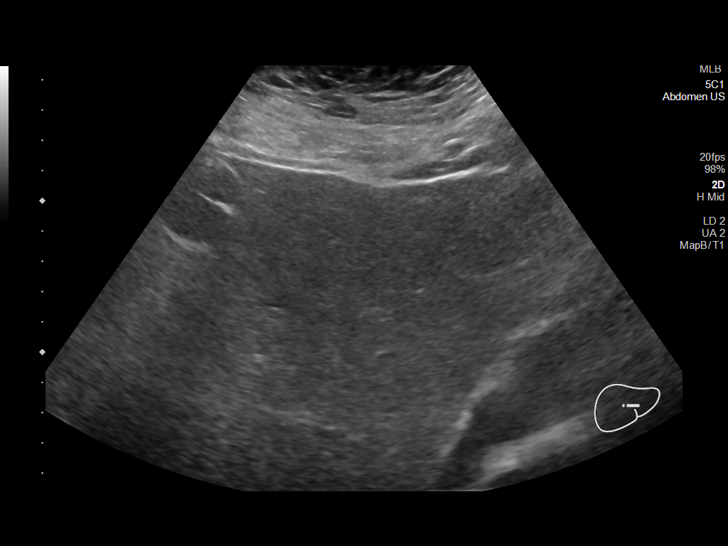
[im 19/36]
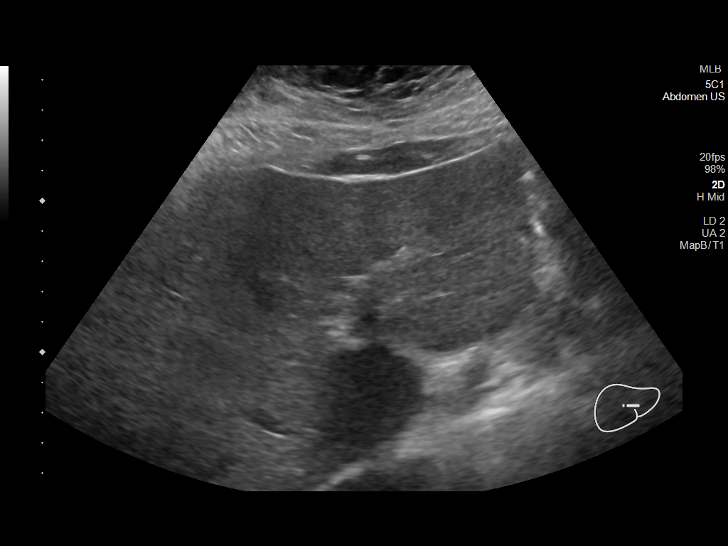
[im 22/36]
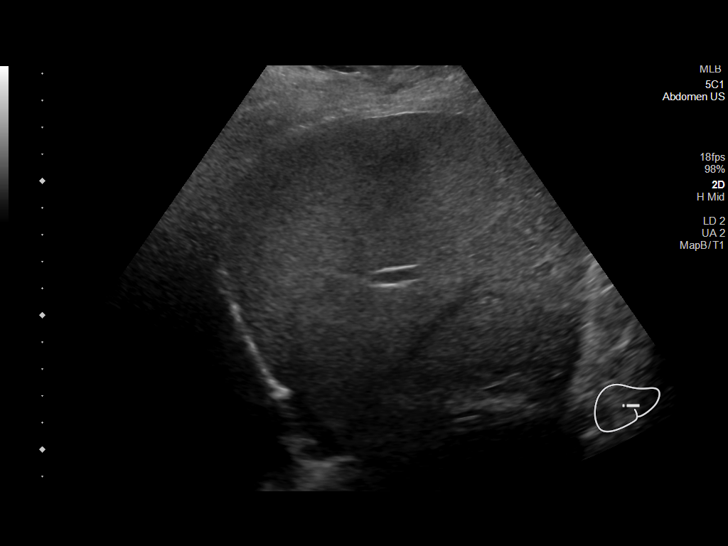
[im 24/36]
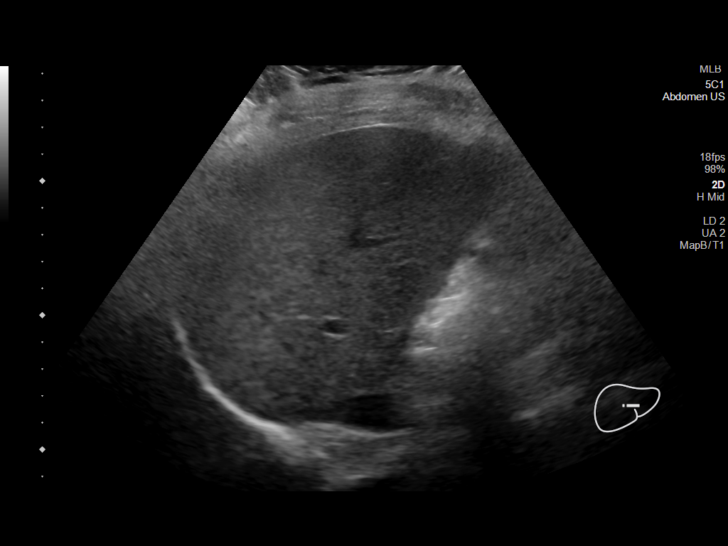
[im 27/36]
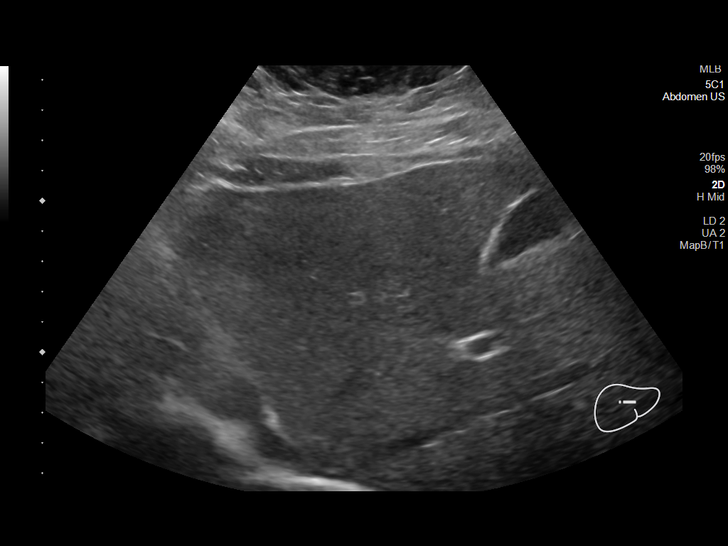
[im 30/36]
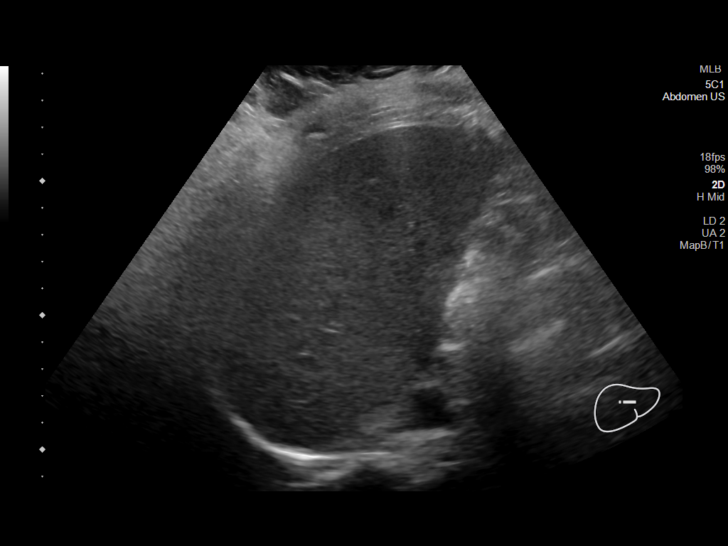
[im 33/36]
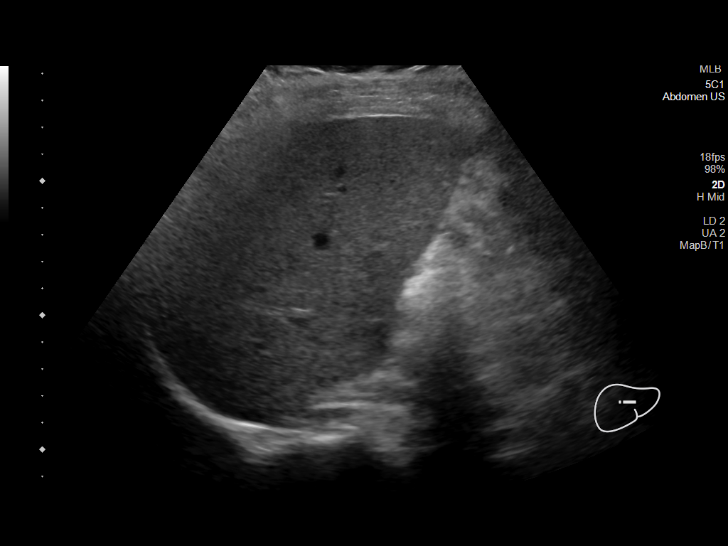
[im 36/36]
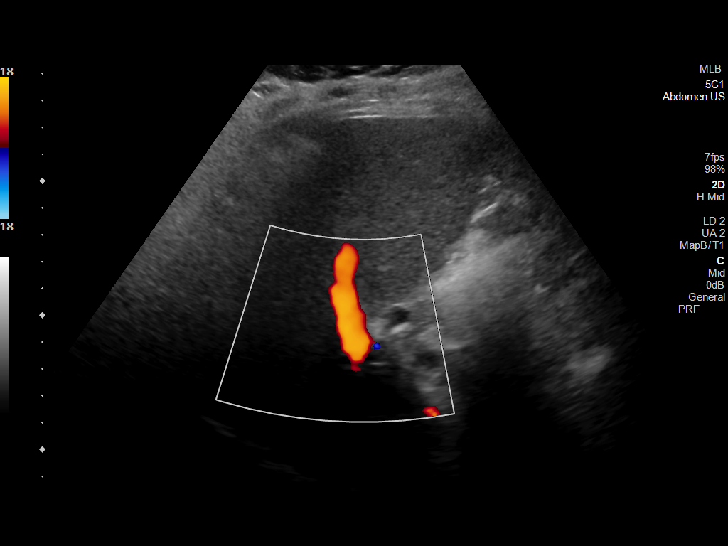

[14 of 25 positions shown; findings below may reference images not displayed]

FINDINGS: Gallbladder:

No gallstones or wall thickening visualized. No sonographic Murphy
sign noted by sonographer.

Common bile duct:

Diameter: 4 mm

Liver:

No focal lesion identified. Within normal limits in parenchymal
echogenicity. Portal vein is patent on color Doppler imaging with
normal direction of blood flow towards the liver.

Other: None.
IMPRESSION: No significant abnormality identified.

## 2023-03-27 ENCOUNTER — Emergency Department (HOSPITAL_COMMUNITY)
Admission: EM | Admit: 2023-03-27 | Discharge: 2023-03-27 | Disposition: A | Discharge status: Home / Self Care | Payer: BC Managed Care – PPO | Attending: Emergency Medicine | Admitting: Emergency Medicine

## 2023-03-27 ENCOUNTER — Encounter (HOSPITAL_COMMUNITY): Payer: Self-pay

## 2023-03-27 ENCOUNTER — Other Ambulatory Visit: Payer: Self-pay

## 2023-03-27 DIAGNOSIS — Z79899 Other long term (current) drug therapy: Secondary | ICD-10-CM | POA: Diagnosis not present

## 2023-03-27 DIAGNOSIS — T7840XA Allergy, unspecified, initial encounter: Secondary | ICD-10-CM

## 2023-03-27 DIAGNOSIS — T6391XA Toxic effect of contact with unspecified venomous animal, accidental (unintentional), initial encounter: Secondary | ICD-10-CM | POA: Insufficient documentation

## 2023-03-27 LAB — I-STAT BETA HCG BLOOD, ED (MC, WL, AP ONLY): I-stat hCG, quantitative: <5 m[IU]/mL (ref <5)

## 2023-03-27 MED ORDER — FAMOTIDINE IN NACL 20-0.9 MG/50ML-% IV SOLN
20.0000 mg | Freq: Once | INTRAVENOUS | Status: AC
  Administered 2023-03-27: 20 mg via INTRAVENOUS
  Filled 2023-03-27: qty 50

## 2023-03-27 MED ORDER — METHYLPREDNISOLONE SODIUM SUCC 125 MG IJ SOLR
125.0000 mg | Freq: Once | INTRAMUSCULAR | Status: AC
  Administered 2023-03-27: 125 mg via INTRAVENOUS
  Filled 2023-03-27: qty 2

## 2023-03-27 MED ORDER — DIPHENHYDRAMINE HCL 50 MG/ML IJ SOLN
25.0000 mg | Freq: Once | INTRAMUSCULAR | Status: AC
  Administered 2023-03-27: 25 mg via INTRAVENOUS
  Filled 2023-03-27: qty 1

## 2023-03-27 MED ORDER — EPINEPHRINE 0.3 MG/0.3ML IJ SOAJ: 0.3000 mg | INTRAMUSCULAR | 0 refills | Status: AC | PRN

## 2023-03-27 NOTE — ED Provider Notes (Signed)
[place] EMERGENCY DEPARTMENT AT [facility] Provider Note   CSN: [id number] Arrival date & time: 03/27/23  2039     History {Add pertinent medical, surgical, social history, OB history to HPI:1} Chief Complaint  Patient presents with   Insect Bite   Allergic Reaction    Renee Avery is a 47 y.o. female.  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  Was outdoors at 7:15 PM when she felt a sharp sting on her right shoulder.  Believes she was stung by a wasp.  Says that she feels itchy on her trunk and her throat feels scratchy as well.  No nausea or vomiting, dizziness, fainting, diarrhea, rash noted.  No shortness of breath.  Does not have an EpiPen.  Has only been diagnosed with an insect allergy after a bite by a spider several years ago.  No other known allergies.       Home Medications Prior to Admission medications   Medication Sig Start Date End Date Taking? Authorizing Provider  cetirizine (ZYRTEC) 10 MG tablet Take 10 mg by mouth daily.    [provider]  cyclobenzaprine (FLEXERIL) 5 MG tablet Take 1 tablet (5 mg total) by mouth 3 (three) times daily as needed for muscle spasms. Patient not taking: Reported on 03/27/2022 10/23/17   Wallis Bamberg, PA-C  famotidine (PEPCID) 40 MG tablet Take 1 tablet (40 mg total) by mouth daily. 01/27/22 04/27/22  Doree Albee, PA-C  ferrous sulfate 325 (65 FE) MG EC tablet Take 325 mg by mouth daily.     [provider]  hydrochlorothiazide (HYDRODIURIL) 12.5 MG tablet Take 12.5 mg by mouth daily. 03/03/22   [provider]  hydrocortisone (ANUSOL-HC) 25 MG suppository Place 1 suppository (25 mg total) rectally 2 (two) times daily. Patient not taking: Reported on 03/27/2022 01/27/22   Doree Albee, PA-C  levocetirizine (XYZAL) 5 MG tablet Take 1 tablet (5 mg total) by mouth every evening. Patient not taking: Reported on 03/27/2022  11/07/16   Bobbitt, Heywood Iles, MD  meloxicam (MOBIC) 15 MG tablet Take 15 mg by mouth daily. Patient not taking: Reported on 03/27/2022 03/23/22   [provider]  meloxicam (MOBIC) 7.5 MG tablet Take 1 tablet (7.5 mg total) by mouth daily. Patient not taking: Reported on 03/27/2022 10/23/17   Wallis Bamberg, PA-C  Multiple Vitamin (MULTIVITAMIN) capsule Take 1 capsule by mouth daily.    [provider]  pantoprazole (PROTONIX) 40 MG tablet Take 1 tablet (40 mg total) by mouth daily. 03/27/22   Long, Arlyss Repress, MD  predniSONE (DELTASONE) 20 MG tablet Take by mouth. Patient not taking: Reported on 03/27/2022 03/09/22   [provider]  VITAMIN D, ERGOCALCIFEROL, PO Take 1 tablet by mouth daily.    [provider]      Allergies    Other    Review of Systems   Review of Systems  Physical Exam Updated Vital Signs BP (!) 142/96 (BP Location: Left Arm)   Pulse 82   Temp 98.5 F (36.9 C) (Oral)   Resp 18   Ht 5\' 2"  (1.575 m)   Wt 97.1 kg   SpO2 100%   BMI 39.14 kg/m  Physical Exam Vitals and nursing note reviewed.  Constitutional:      General: She is not in acute distress.    Appearance: She is well-developed.  HENT:     Head: Normocephalic and atraumatic.  Right Ear: External ear normal.     Left Ear: External ear normal.     Nose: Nose normal.  Eyes:     Extraocular Movements: Extraocular movements intact.     Conjunctiva/sclera: Conjunctivae normal.     Pupils: Pupils are equal, round, and reactive to light.  Cardiovascular:     Rate and Rhythm: Normal rate and regular rhythm.     Heart sounds: No murmur heard. Pulmonary:     Effort: Pulmonary effort is normal. No respiratory distress.     Breath sounds: Normal breath sounds.  Abdominal:     General: Abdomen is flat. There is no distension.     Palpations: Abdomen is soft. There is no mass.     Tenderness: There is no abdominal tenderness. There is no guarding.  Musculoskeletal:      Cervical back: Normal range of motion and neck supple.     Right lower leg: No edema.     Left lower leg: No edema.  Skin:    General: Skin is warm and dry.     Findings: No rash.  Neurological:     Mental Status: She is alert and oriented to person, place, and time. Mental status is at baseline.  Psychiatric:        Mood and Affect: Mood normal.     ED Results / Procedures / Treatments   Labs (all labs ordered are listed, but only abnormal results are displayed) Labs Reviewed  I-STAT BETA HCG BLOOD, ED ([facility], [facility], [facility] ONLY)    EKG None  Radiology No results found.  Procedures Procedures  {Document cardiac monitor, telemetry assessment procedure when appropriate:1}  Medications Ordered in ED Medications  diphenhydrAMINE (BENADRYL) injection 25 mg (has no administration in time range)  methylPREDNISolone sodium succinate (SOLU-MEDROL) 125 mg/2 mL injection 125 mg (has no administration in time range)  famotidine (PEPCID) IVPB 20 mg premix (has no administration in time range)    ED Course/ Medical Decision Making/ A&P   {   Click here for ABCD2, HEART and other calculatorsREFRESH Note before signing :1}                          Medical Decision Making Risk Prescription drug management.   ***  {Document critical care time when appropriate:1} {Document review of labs and clinical decision tools ie heart score, Chads2Vasc2 etc:1}  {Document your independent review of radiology images, and any outside records:1} {Document your discussion with family members, caretakers, and with consultants:1} {Document social determinants of health affecting pt's care:1} {Document your decision making why or why not admission, treatments were needed:1} Final Clinical Impression(s) / ED Diagnoses Final diagnoses:  None    Rx / DC Orders ED Discharge Orders     None

## 2023-03-27 NOTE — ED Triage Notes (Signed)
Patient reports she thinks she was bitten or stung by something about an hour ago on her upper back on right side. Redness noted on back of right shoulder. States she feels itchy all over and throat itching. 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

## 2023-03-27 NOTE — Discharge Instructions (Signed)
You were seen for your insect sting and allergic reaction in the emergency department.   At home, please use Tylenol and ibuprofen for any pain at the site of the sting.  If you develop any shortness of breath please use the EpiPen that you were prescribed.  Check your MyChart online for the results of any tests that had not resulted by the time you left the emergency department.   Follow-up with your primary doctor in 2-3 days regarding your visit.  Follow-up with an allergist about your reaction.  Return immediately to the emergency department if you experience any of the following: Difficulty breathing, fainting, or any other concerning symptoms.    Thank you for visiting our Emergency Department. It was a pleasure taking care of you today.

## 2023-03-27 NOTE — ED Provider Notes (Incomplete)
[place] EMERGENCY DEPARTMENT AT [facility] Provider Note   CSN: [id number] Arrival date & time: 03/27/23  2039     History {Add pertinent medical, surgical, social history, OB history to HPI:1} Chief Complaint  Patient presents with  . Insect Bite  . Allergic Reaction    Renee Avery is a 47 y.o. female.  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  Was outdoors at 7:15 PM when she felt a sharp sting on her right shoulder.  Believes she was stung by a wasp.  Says that she feels itchy on her trunk and her throat feels scratchy as well.  No nausea or vomiting, dizziness, fainting, diarrhea, rash noted.  No shortness of breath.  Does not have an EpiPen.  Has only been diagnosed with an insect allergy after a bite by a spider several years ago.  No other known allergies.       Home Medications Prior to Admission medications   Medication Sig Start Date End Date Taking? Authorizing Provider  cetirizine (ZYRTEC) 10 MG tablet Take 10 mg by mouth daily.    [provider]  cyclobenzaprine (FLEXERIL) 5 MG tablet Take 1 tablet (5 mg total) by mouth 3 (three) times daily as needed for muscle spasms. Patient not taking: Reported on 03/27/2022 10/23/17   Wallis Bamberg, PA-C  famotidine (PEPCID) 40 MG tablet Take 1 tablet (40 mg total) by mouth daily. 01/27/22 04/27/22  Doree Albee, PA-C  ferrous sulfate 325 (65 FE) MG EC tablet Take 325 mg by mouth daily.     [provider]  hydrochlorothiazide (HYDRODIURIL) 12.5 MG tablet Take 12.5 mg by mouth daily. 03/03/22   [provider]  hydrocortisone (ANUSOL-HC) 25 MG suppository Place 1 suppository (25 mg total) rectally 2 (two) times daily. Patient not taking: Reported on 03/27/2022 01/27/22   Doree Albee, PA-C  levocetirizine (XYZAL) 5 MG tablet Take 1 tablet (5 mg total) by mouth every evening. Patient not taking: Reported on 03/27/2022  11/07/16   Bobbitt, Heywood Iles, MD  meloxicam (MOBIC) 15 MG tablet Take 15 mg by mouth daily. Patient not taking: Reported on 03/27/2022 03/23/22   [provider]  meloxicam (MOBIC) 7.5 MG tablet Take 1 tablet (7.5 mg total) by mouth daily. Patient not taking: Reported on 03/27/2022 10/23/17   Wallis Bamberg, PA-C  Multiple Vitamin (MULTIVITAMIN) capsule Take 1 capsule by mouth daily.    [provider]  pantoprazole (PROTONIX) 40 MG tablet Take 1 tablet (40 mg total) by mouth daily. 03/27/22   Long, Arlyss Repress, MD  predniSONE (DELTASONE) 20 MG tablet Take by mouth. Patient not taking: Reported on 03/27/2022 03/09/22   [provider]  VITAMIN D, ERGOCALCIFEROL, PO Take 1 tablet by mouth daily.    [provider]      Allergies    Other    Review of Systems   Review of Systems  Physical Exam Updated Vital Signs BP (!) 142/96 (BP Location: Left Arm)   Pulse 82   Temp 98.5 F (36.9 C) (Oral)   Resp 18   Ht 5\' 2"  (1.575 m)   Wt 97.1 kg   SpO2 100%   BMI 39.14 kg/m  Physical Exam Vitals and nursing note reviewed.  Constitutional:      General: She is not in acute distress.    Appearance: She is well-developed.  HENT:     Head: Normocephalic and atraumatic.  Right Ear: External ear normal.     Left Ear: External ear normal.     Nose: Nose normal.  Eyes:     Extraocular Movements: Extraocular movements intact.     Conjunctiva/sclera: Conjunctivae normal.     Pupils: Pupils are equal, round, and reactive to light.  Cardiovascular:     Rate and Rhythm: Normal rate and regular rhythm.     Heart sounds: No murmur heard. Pulmonary:     Effort: Pulmonary effort is normal. No respiratory distress.     Breath sounds: Normal breath sounds.  Abdominal:     General: Abdomen is flat. There is no distension.     Palpations: Abdomen is soft. There is no mass.     Tenderness: There is no abdominal tenderness. There is no guarding.  Musculoskeletal:      Cervical back: Normal range of motion and neck supple.     Right lower leg: No edema.     Left lower leg: No edema.  Skin:    General: Skin is warm and dry.     Findings: No rash.  Neurological:     Mental Status: She is alert and oriented to person, place, and time. Mental status is at baseline.  Psychiatric:        Mood and Affect: Mood normal.     ED Results / Procedures / Treatments   Labs (all labs ordered are listed, but only abnormal results are displayed) Labs Reviewed  I-STAT BETA HCG BLOOD, ED ([facility], [facility], [facility] ONLY)    EKG None  Radiology No results found.  Procedures Procedures  {Document cardiac monitor, telemetry assessment procedure when appropriate:1}  Medications Ordered in ED Medications  diphenhydrAMINE (BENADRYL) injection 25 mg (has no administration in time range)  methylPREDNISolone sodium succinate (SOLU-MEDROL) 125 mg/2 mL injection 125 mg (has no administration in time range)  famotidine (PEPCID) IVPB 20 mg premix (has no administration in time range)    ED Course/ Medical Decision Making/ A&P   {   Click here for ABCD2, HEART and other calculatorsREFRESH Note before signing :1}                          Medical Decision Making Risk Prescription drug management.   ***  {Document critical care time when appropriate:1} {Document review of labs and clinical decision tools ie heart score, Chads2Vasc2 etc:1}  {Document your independent review of radiology images, and any outside records:1} {Document your discussion with family members, caretakers, and with consultants:1} {Document social determinants of health affecting pt's care:1} {Document your decision making why or why not admission, treatments were needed:1} Final Clinical Impression(s) / ED Diagnoses Final diagnoses:  None    Rx / DC Orders ED Discharge Orders     None

## 2023-04-25 IMAGING — CR DG CHEST 2V
2 series · 2 of 2 positions shown · non-contrast
Comparison: Chest radiograph dated 03/21/2019.

CLINICAL DATA: Chest pain.

EXAM:
CHEST - 2 VIEW

[w chest pa]
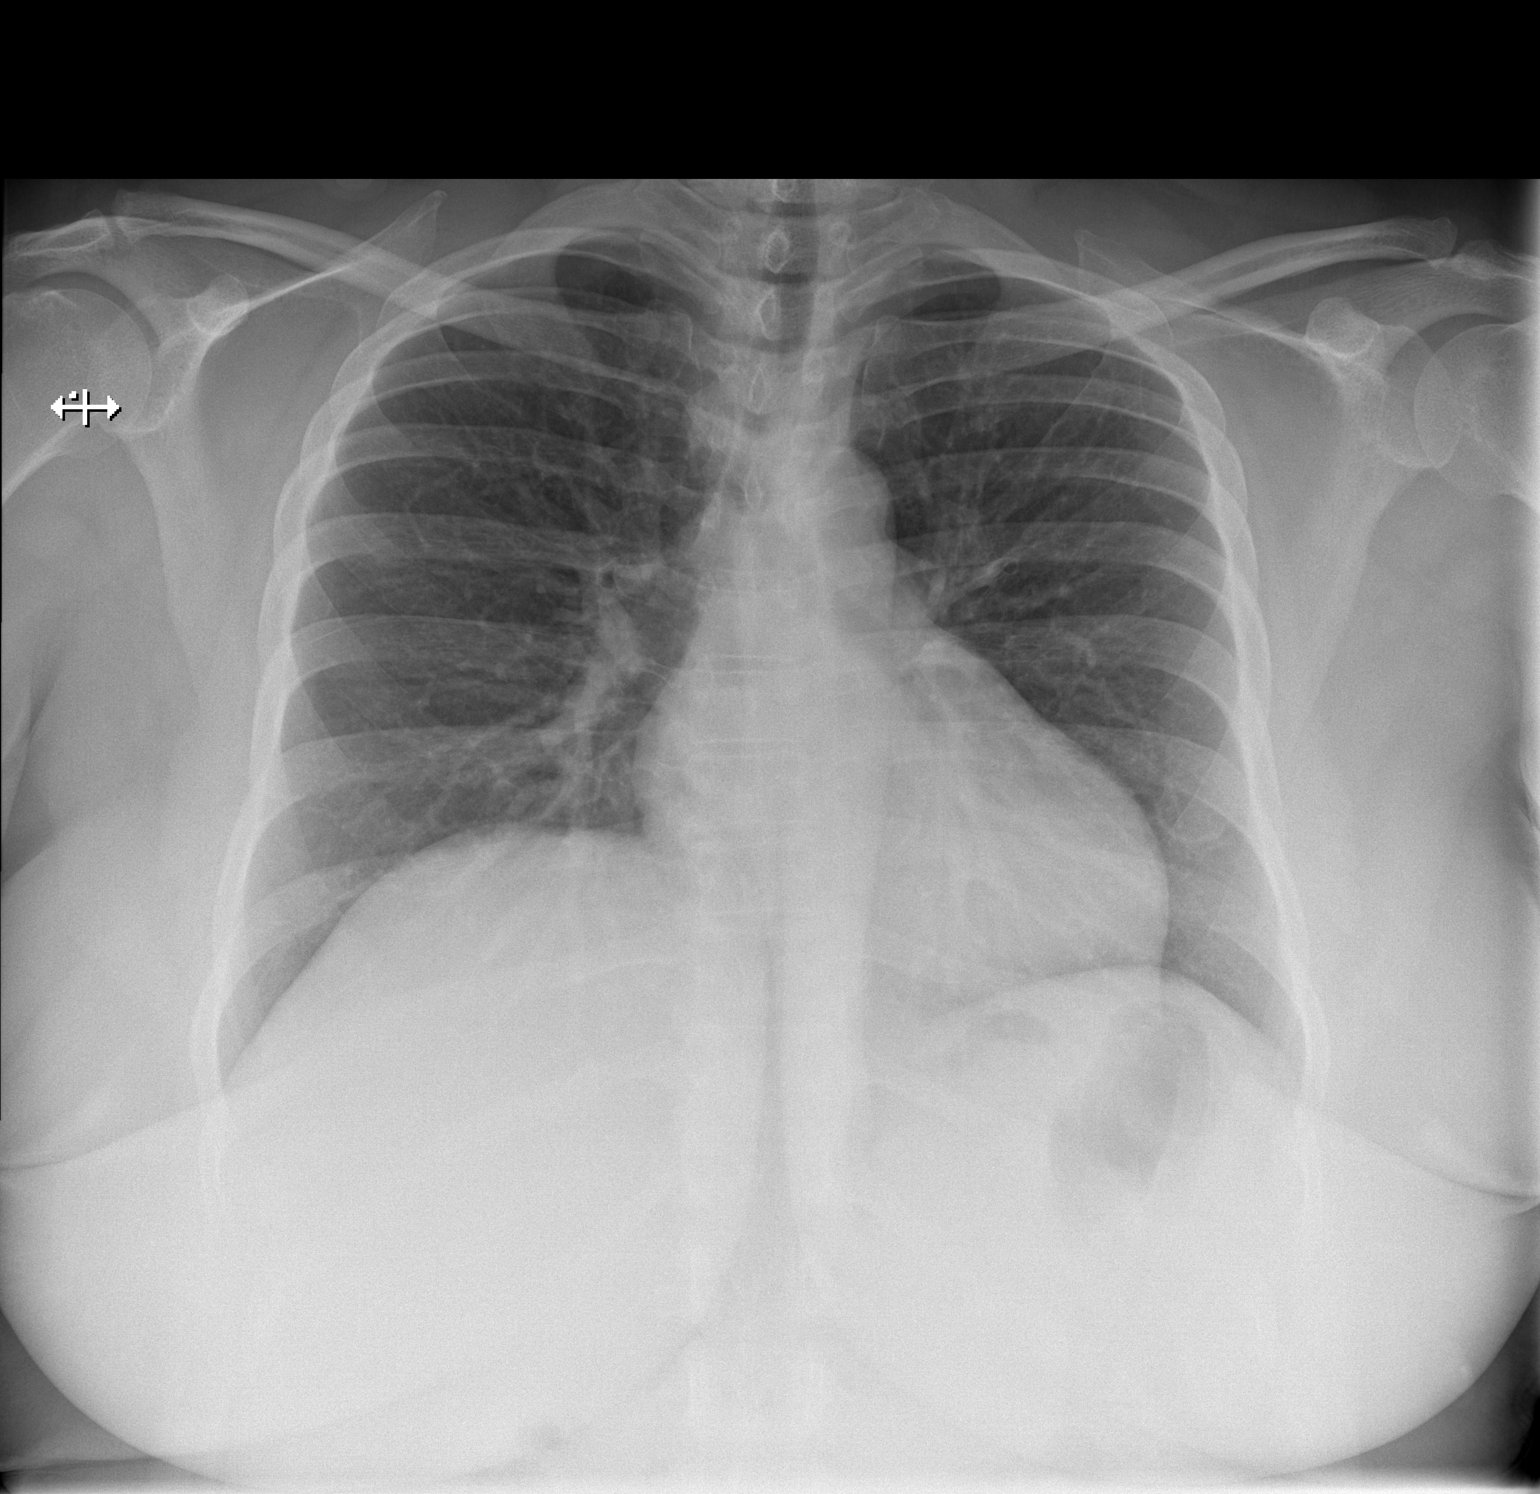

[w chest lat]
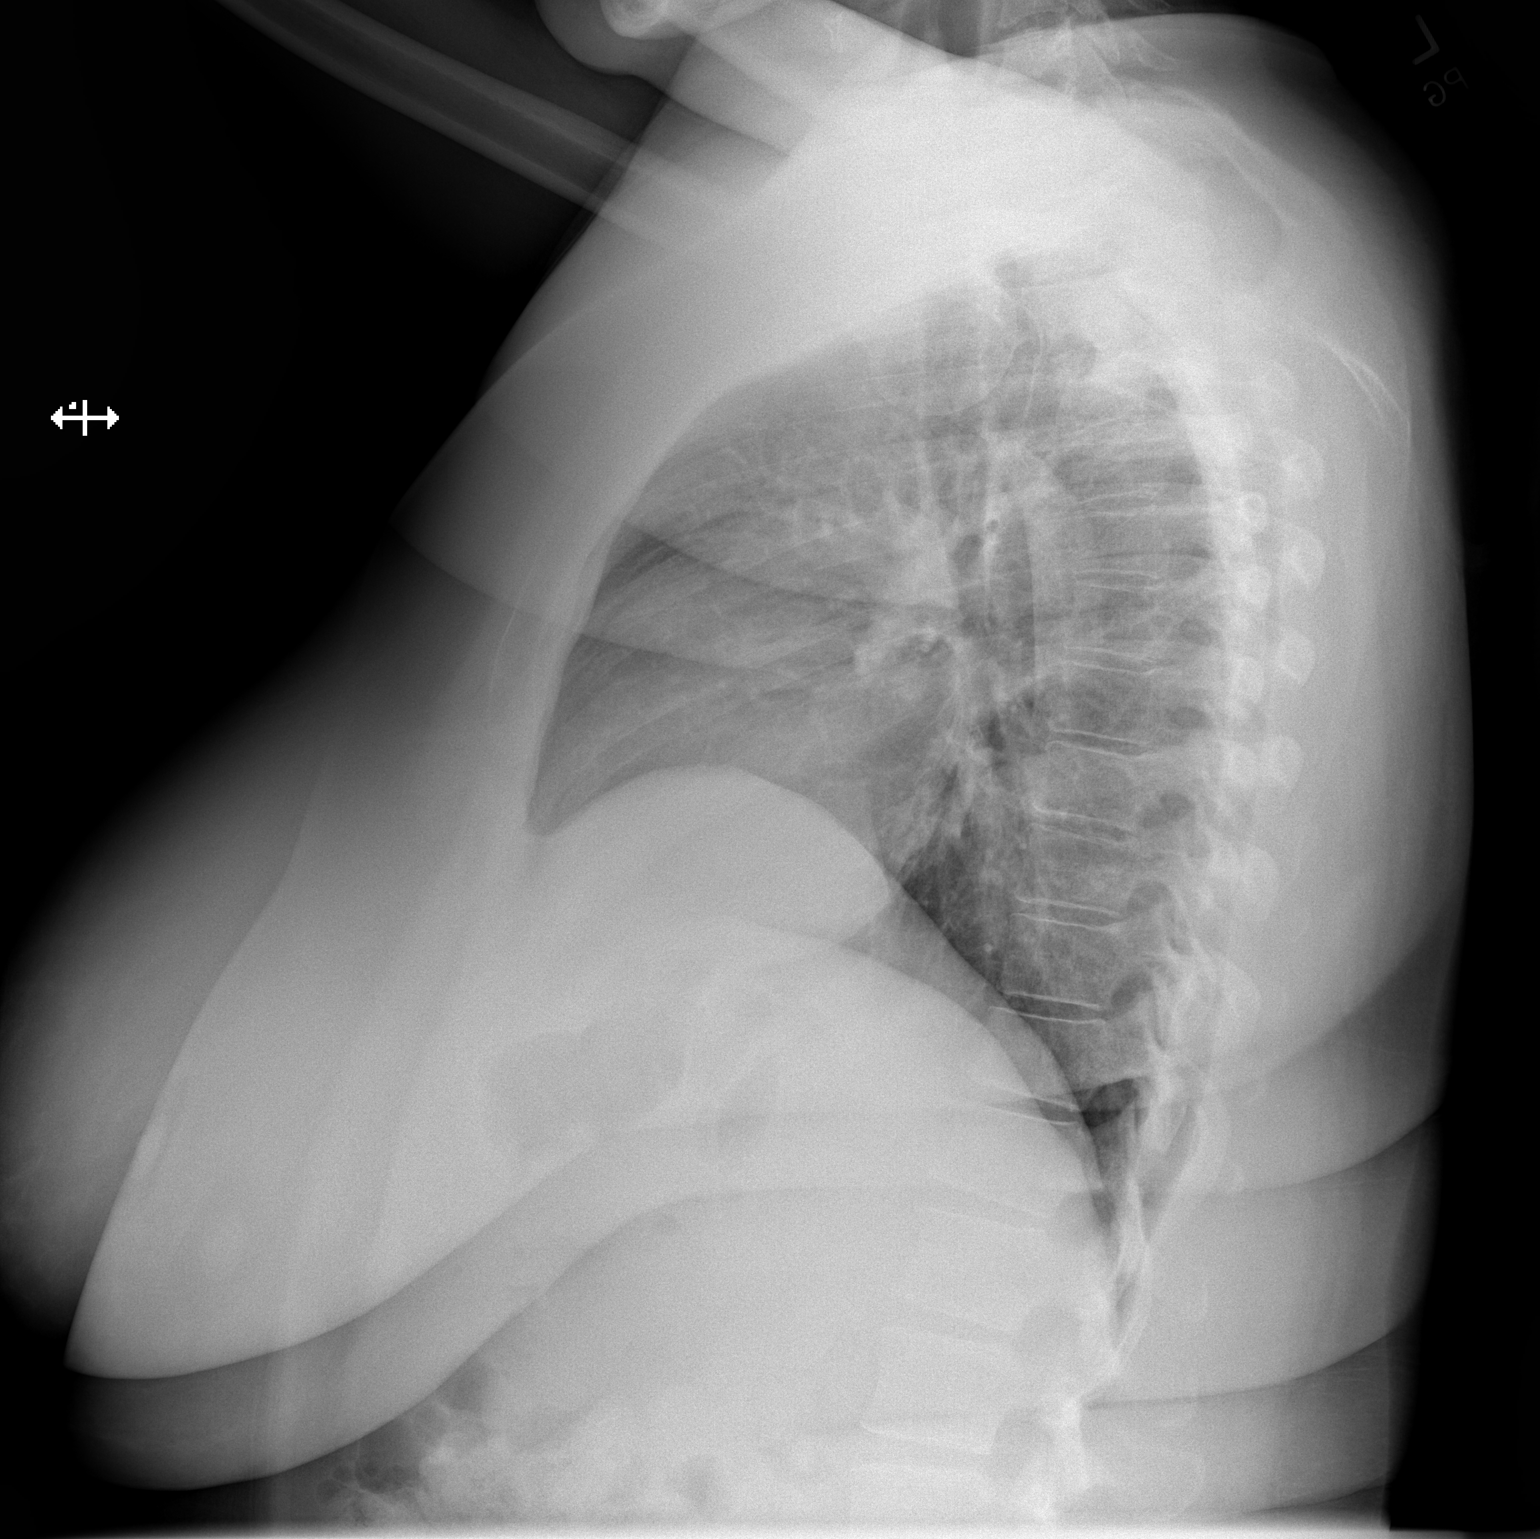

[2 of 2 positions shown; findings below may reference images not displayed]

FINDINGS: The heart size and mediastinal contours are within normal limits.
Both lungs are clear. The visualized skeletal structures are
unremarkable.
IMPRESSION: No active cardiopulmonary disease.

## 2023-04-27 ENCOUNTER — Other Ambulatory Visit: Payer: Self-pay

## 2023-04-27 ENCOUNTER — Encounter (HOSPITAL_BASED_OUTPATIENT_CLINIC_OR_DEPARTMENT_OTHER): Payer: Self-pay

## 2023-04-27 ENCOUNTER — Observation Stay (HOSPITAL_BASED_OUTPATIENT_CLINIC_OR_DEPARTMENT_OTHER)
Admission: EM | Admit: 2023-04-27 | Discharge: 2023-04-28 | Disposition: A | Discharge status: Home / Self Care | Payer: BC Managed Care – PPO | Attending: Family Medicine | Admitting: Family Medicine

## 2023-04-27 ENCOUNTER — Emergency Department (HOSPITAL_BASED_OUTPATIENT_CLINIC_OR_DEPARTMENT_OTHER): Payer: BC Managed Care – PPO

## 2023-04-27 DIAGNOSIS — R0789 Other chest pain: Secondary | ICD-10-CM | POA: Diagnosis not present

## 2023-04-27 DIAGNOSIS — I10 Essential (primary) hypertension: Secondary | ICD-10-CM | POA: Insufficient documentation

## 2023-04-27 DIAGNOSIS — K219 Gastro-esophageal reflux disease without esophagitis: Secondary | ICD-10-CM

## 2023-04-27 DIAGNOSIS — Z87891 Personal history of nicotine dependence: Secondary | ICD-10-CM | POA: Diagnosis not present

## 2023-04-27 DIAGNOSIS — Z79899 Other long term (current) drug therapy: Secondary | ICD-10-CM | POA: Diagnosis not present

## 2023-04-27 LAB — CBC
HCT: 43.2 % (ref 36.0–46.0)
Hemoglobin: 13.4 g/dL (ref 12.0–15.0)
MCH: 21.6 pg — ABNORMAL LOW (ref 26.0–34.0)
MCHC: 31 g/dL (ref 30.0–36.0)
MCV: 69.6 fL — ABNORMAL LOW (ref 80.0–100.0)
Platelets: 308 10*3/uL (ref 150–400)
RBC: 6.21 MIL/uL — ABNORMAL HIGH (ref 3.87–5.11)
RDW: 14.7 % (ref 11.5–15.5)
WBC: 7.7 10*3/uL (ref 4.0–10.5)
nRBC: 0 % (ref 0.0–0.2)

## 2023-04-27 LAB — TROPONIN I (HIGH SENSITIVITY)
Troponin I (High Sensitivity): 5 ng/L (ref <18)
Troponin I (High Sensitivity): 5 ng/L (ref <18)

## 2023-04-27 LAB — BASIC METABOLIC PANEL
Anion gap: 8 (ref 5–15)
BUN: 11 mg/dL (ref 6–20)
CO2: 26 mmol/L (ref 22–32)
Calcium: 8.8 mg/dL — ABNORMAL LOW (ref 8.9–10.3)
Chloride: 100 mmol/L (ref 98–111)
Creatinine, Ser: 0.73 mg/dL (ref 0.44–1.00)
GFR, Estimated: >60 mL/min (ref >60)
Glucose, Bld: 96 mg/dL (ref 70–99)
Potassium: 3.6 mmol/L (ref 3.5–5.1)
Sodium: 134 mmol/L — ABNORMAL LOW (ref 135–145)

## 2023-04-27 LAB — D-DIMER, QUANTITATIVE: D-Dimer, Quant: 0.49 ug/mL-FEU (ref 0.00–0.50)

## 2023-04-27 MED ORDER — SODIUM CHLORIDE 0.9% FLUSH
3.0000 mL | Freq: Two times a day (BID) | INTRAVENOUS | Status: DC
  Administered 2023-04-28: 3 mL via INTRAVENOUS

## 2023-04-27 MED ORDER — ENOXAPARIN SODIUM 40 MG/0.4ML IJ SOSY: 40.0000 mg | PREFILLED_SYRINGE | INTRAMUSCULAR | Status: DC

## 2023-04-27 MED ORDER — HYDROCHLOROTHIAZIDE 12.5 MG PO TABS
12.5000 mg | ORAL_TABLET | Freq: Every day | ORAL | Status: DC
  Administered 2023-04-28: 12.5 mg via ORAL
  Filled 2023-04-27: qty 1

## 2023-04-27 MED ORDER — PANTOPRAZOLE SODIUM 40 MG PO TBEC
40.0000 mg | DELAYED_RELEASE_TABLET | Freq: Once | ORAL | Status: AC
  Administered 2023-04-27: 40 mg via ORAL
  Filled 2023-04-27: qty 1

## 2023-04-27 MED ORDER — ONDANSETRON HCL 4 MG/2ML IJ SOLN: 4.0000 mg | Freq: Four times a day (QID) | INTRAMUSCULAR | Status: DC | PRN

## 2023-04-27 MED ORDER — SENNOSIDES-DOCUSATE SODIUM 8.6-50 MG PO TABS: 1.0000 | ORAL_TABLET | Freq: Every evening | ORAL | Status: DC | PRN

## 2023-04-27 MED ORDER — ACETAMINOPHEN 325 MG PO TABS: 650.0000 mg | ORAL_TABLET | Freq: Four times a day (QID) | ORAL | Status: DC | PRN

## 2023-04-27 MED ORDER — FAMOTIDINE 20 MG PO TABS
40.0000 mg | ORAL_TABLET | Freq: Every day | ORAL | Status: DC
  Administered 2023-04-28: 40 mg via ORAL
  Filled 2023-04-27: qty 2

## 2023-04-27 MED ORDER — ACETAMINOPHEN 650 MG RE SUPP: 650.0000 mg | Freq: Four times a day (QID) | RECTAL | Status: DC | PRN

## 2023-04-27 MED ORDER — ONDANSETRON HCL 4 MG PO TABS: 4.0000 mg | ORAL_TABLET | Freq: Four times a day (QID) | ORAL | Status: DC | PRN

## 2023-04-27 MED ORDER — ALUM & MAG HYDROXIDE-SIMETH 200-200-20 MG/5ML PO SUSP
30.0000 mL | Freq: Once | ORAL | Status: AC
  Administered 2023-04-27: 30 mL via ORAL
  Filled 2023-04-27: qty 30

## 2023-04-27 NOTE — Progress Notes (Signed)
   Patient Name: Renee Avery, Renee Avery DOB: 13-Jun-1976 MRN: [id number] Transferring facility: [facility] Requesting provider: smoot, PA Reason for transfer: CP, left sided facial numbness 47 yo AAF with CP that started today. troponin negative x 2. EKG showed PVCs.  cardiology said discharge. EDP refused.  Going to: [facility] Admission Status: obs Bed Type: cardiac tele To Do: consult cardiology to see patient  [facility] will assume care on arrival to accepting facility. Until arrival, medical decision making responsibilities remain with the EDP.  However, [facility] available 24/7 for questions and assistance.   Nursing staff please page [facility] Admits and Consults [phone]) as soon as the patient arrives to the hospital.  Carollee Herter, DO [facility]

## 2023-04-27 NOTE — ED Triage Notes (Signed)
Pt c/o L sided CP onset "maybe ago." Also c/o L sided facial pain/ numbness. Dizziness/ intermittent HA earlier today, dizziness has since resolved. Denies N/V, additional symptoms. No known respiratory hx, cardiac hx

## 2023-04-27 NOTE — ED Notes (Signed)
Report called to [facility] RN @ [facility]

## 2023-04-27 NOTE — H&P (Signed)
History and Physical    Renee Avery ZOX:[id number] DOB: 04-12-1976 DOA: 04/27/2023  PCP: [facility], [facility]  Patient coming from: Home  I have personally briefly reviewed patient's old medical records in [facility]  Chief Complaint: Chest discomfort  HPI: Renee Avery is a 47 y.o. female with medical history significant for HTN, GERD who presented to the ED for evaluation of chest discomfort.  Patient states that she was driving from downtown [place] when she developed a warm sensation in her left upper chest area.  Initially she thought these were related to her reflux symptoms as she felt like she had to burp however symptoms persisted.  She noted some radiation to her left neck and lower face.  There was no radiation to her arms or back.  She says symptoms were not necessarily painful, she just knew that something was off and drove to the ED for further evaluation.  She reports a somewhat similar episode about 1 year ago.  She says she was seen in the hospital and subsequently diagnosed as symptoms being related to heartburn.  Patient states that her father died of a heart attack at age 62.  She denies tobacco use.  She reports occasional ibuprofen use, last use about 1 week ago.  [facility] ED Course  Labs/Imaging on admission: I have personally reviewed following labs and imaging studies.  Initial vitals showed BP 135/78, pulse 68, RR 16, temp 98.0 F, SpO2 100% on room air.  Labs show WBC 7.7, hemoglobin 13.4, platelets 308,000, sodium 134, potassium 3.6, bicarb 26, BUN 11, creatinine 0.73, serum glucose 96, D-dimer 0.49, troponin 5 x 2.  Portable chest x-ray negative for focal consolidation, edema, effusion.  Patient was given oral Protonix and Maalox.  While in the ED patient had nonsustained 6 beat run of V. tach.  EDPA discussed with cardiology Dr. Izora Ribas who favored these to represent PVCs as opposed to V. tach and felt  patient could potentially be discharged to home if remaining workup unremarkable.  ED attending however felt patient should be admitted for further workup.  They discussed again with Dr. Izora Ribas who agreed to consult on patient while admitted.  The hospitalist service was consulted to admit for further evaluation and management.    Review of Systems: All systems reviewed and are negative except as documented in history of present illness above.   Past Medical History:  Diagnosis Date   Allergic urticaria    Allergy    bug bite; hives   Anemia    DDD (degenerative disc disease), cervical    Eczema    Hemorrhoid    Obesity    Prediabetes    Spondylolysis of cervical region    Vitamin D deficiency     Past Surgical History:  Procedure Laterality Date   uterine biopsy     benign    Social History:  reports that she has quit smoking. Her smoking use included cigarettes. She has never used smokeless tobacco. She reports current alcohol use. She reports that she does not use drugs.  Allergies  Allergen Reactions   Other Anaphylaxis    Bug bites    Family History  Problem Relation Age of Onset   Colon cancer Mother        in her 25s   Colon polyps Mother    Heart attack Father    Asthma Sister    Eczema Sister    Food Allergy Sister  shellfish, chocolate   Eczema Sister    Allergic rhinitis Neg Hx    Angioedema Neg Hx    Stomach cancer Neg Hx    Esophageal cancer Neg Hx    Pancreatic cancer Neg Hx      Prior to Admission medications   Medication Sig Start Date End Date Taking? Authorizing Provider  cetirizine (ZYRTEC) 10 MG tablet Take 10 mg by mouth daily.    [provider]  cyclobenzaprine (FLEXERIL) 5 MG tablet Take 1 tablet (5 mg total) by mouth 3 (three) times daily as needed for muscle spasms. Patient not taking: Reported on 03/27/2022 10/23/17   Wallis Bamberg, PA-C  EPINEPHrine 0.3 mg/0.3 mL IJ SOAJ injection Inject 0.3 mg into the muscle  as needed for anaphylaxis. 03/27/23   Rondel Baton, MD  famotidine (PEPCID) 40 MG tablet Take 1 tablet (40 mg total) by mouth daily. 01/27/22 04/27/22  Doree Albee, PA-C  ferrous sulfate 325 (65 FE) MG EC tablet Take 325 mg by mouth daily.     [provider]  hydrochlorothiazide (HYDRODIURIL) 12.5 MG tablet Take 12.5 mg by mouth daily. 03/03/22   [provider]  hydrocortisone (ANUSOL-HC) 25 MG suppository Place 1 suppository (25 mg total) rectally 2 (two) times daily. Patient not taking: Reported on 03/27/2022 01/27/22   Doree Albee, PA-C  levocetirizine (XYZAL) 5 MG tablet Take 1 tablet (5 mg total) by mouth every evening. Patient not taking: Reported on 03/27/2022 11/07/16   Bobbitt, Heywood Iles, MD  meloxicam (MOBIC) 15 MG tablet Take 15 mg by mouth daily. Patient not taking: Reported on 03/27/2022 03/23/22   [provider]  meloxicam (MOBIC) 7.5 MG tablet Take 1 tablet (7.5 mg total) by mouth daily. Patient not taking: Reported on 03/27/2022 10/23/17   Wallis Bamberg, PA-C  Multiple Vitamin (MULTIVITAMIN) capsule Take 1 capsule by mouth daily.    [provider]  pantoprazole (PROTONIX) 40 MG tablet Take 1 tablet (40 mg total) by mouth daily. 03/27/22   Long, Arlyss Repress, MD  predniSONE (DELTASONE) 20 MG tablet Take by mouth. Patient not taking: Reported on 03/27/2022 03/09/22   [provider]  VITAMIN D, ERGOCALCIFEROL, PO Take 1 tablet by mouth daily.    [provider]    Physical Exam: Vitals:   04/27/23 2100 04/27/23 2145 04/27/23 2200 04/27/23 2254  BP: 116/60  131/72 (!) 146/82  Pulse: (!) 53  (!) 51   Resp: 18  16 18   Temp:  98 F (36.7 C)  98.5 F (36.9 C)  TempSrc:  Oral  Oral  SpO2: 100%  100% 100%  Weight:    96.6 kg  Height:    5' 2.5" (1.588 m)   Constitutional: Resting in bed, NAD, calm, comfortable Eyes: EOMI, lids and conjunctivae normal ENMT: Mucous membranes are moist. Posterior pharynx clear of any  exudate or lesions.Normal dentition.  Neck: normal, supple, no masses. Respiratory: clear to auscultation bilaterally, no wheezing, no crackles. Normal respiratory effort. No accessory muscle use.  Cardiovascular: Regular rate and rhythm, no murmurs / rubs / gallops. No extremity edema. 2+ pedal pulses. Abdomen: no tenderness, no masses palpated.  Musculoskeletal: no clubbing / cyanosis. No joint deformity upper and lower extremities. Good ROM, no contractures. Normal muscle tone.  Skin: no rashes, lesions, ulcers. No induration Neurologic: Sensation intact. Strength 5/5 in all 4.  Psychiatric: Normal judgment and insight. Alert and oriented x 3. Normal mood.   EKG: Personally reviewed.  Sinus rhythm, rate 55,  no acute ischemic changes.  Rate is slower when compared to prior.  Assessment/Plan Principal Problem:   Atypical chest pain Active Problems:   GERD (gastroesophageal reflux disease)   Nailyn Dearinger is a 47 y.o. female with medical history significant for HTN, GERD who is admitted for chest pain evaluation.  Assessment and Plan: Atypical chest pain: Nonspecific chest discomfort with negative troponin x 2.  EKG without acute ischemic changes.  Brief run of PVCs while in the ED.  ED team requested admission for further cardiac evaluation.  They discussed with cardiology who will consult in the morning. -Keep on telemetry overnight -Obtain echocardiogram  Hypertension: Continue HCTZ.  Patient states that she is prescribed this for occasional lower extremity swelling.  GERD: Continue Pepcid.   DVT prophylaxis: enoxaparin (LOVENOX) injection 40 mg Start: 04/28/23 2200 Code Status: Full code, confirmed with patient on admission Family Communication: Discussed with patient, she has discussed with family Disposition Plan: From home and likely discharge to home pending clinical progress Consults called: Cardiology Severity of Illness: The appropriate patient status for this patient  is OBSERVATION. Observation status is judged to be reasonable and necessary in order to provide the required intensity of service to ensure the patient's safety. The patient's presenting symptoms, physical exam findings, and initial radiographic and laboratory data in the context of their medical condition is felt to place them at decreased risk for further clinical deterioration. Furthermore, it is anticipated that the patient will be medically stable for discharge from the hospital within 2 midnights of admission.   Darreld Mclean MD [facility]  If 7PM-7AM, please contact night-coverage [email or web address]  04/27/2023, 11:36 PM

## 2023-04-27 NOTE — ED Notes (Signed)
[facility] called for transport @21 :41

## 2023-04-27 NOTE — ED Notes (Signed)
Upon receiving blood, patient stated she felt "funny". Patient's HR went from low 50's to high 90's and a witnessed unsustained run of V-Tach occurred. Patient states "that is feeling that makes my chest hurt". EDP made aware, same showed on monitor.

## 2023-04-27 NOTE — ED Notes (Signed)
Report given to [facility]. 

## 2023-04-27 NOTE — ED Provider Notes (Signed)
[place] [facility] Provider Note   CSN: [id number] Arrival date & time: 04/27/23  1648     History  Chief Complaint  Patient presents with   Chest Pain    Renee Avery is a 47 y.o. female with medical history of vitamin D deficiency, obesity, hemorrhoids, eczema, DDD, anemia.  Patient presents to ED for evaluation of chest pain.  The patient reports that about 2 hours ago she developed a left-sided chest pain described as burning associated with dizziness.  She denies that this left-sided chest pain radiates.  She denies any shortness of breath.  She states that prior to this chest pain onset she had just gotten into her car and had just completed attending a conference.  The patient states that initially she had the sudden onset of chest pain and thought it might be secondary to her GERD, she burped a couple times and it did relieve the chest pain but the chest pain returned after she purchased a ginger ale.  She denies any nausea, vomiting, abdominal pain, back pain, lightheadedness.  She is endorsing dizziness, denies weakness.  She denies leg swelling, fevers.  She denies ever seeing cardiology.   Chest Pain Associated symptoms: dizziness   Associated symptoms: no abdominal pain, no cough, no fever, no nausea, no numbness, no shortness of breath, no vomiting and no weakness        Home Medications Prior to Admission medications   Medication Sig Start Date End Date Taking? Authorizing Provider  cetirizine (ZYRTEC) 10 MG tablet Take 10 mg by mouth daily.    [provider]  cyclobenzaprine (FLEXERIL) 5 MG tablet Take 1 tablet (5 mg total) by mouth 3 (three) times daily as needed for muscle spasms. Patient not taking: Reported on 03/27/2022 10/23/17   Wallis Bamberg, PA-C  EPINEPHrine 0.3 mg/0.3 mL IJ SOAJ injection Inject 0.3 mg into the muscle as needed for anaphylaxis. 03/27/23   Rondel Baton, MD  famotidine (PEPCID) 40 MG tablet Take  1 tablet (40 mg total) by mouth daily. 01/27/22 04/27/22  Doree Albee, PA-C  ferrous sulfate 325 (65 FE) MG EC tablet Take 325 mg by mouth daily.     [provider]  hydrochlorothiazide (HYDRODIURIL) 12.5 MG tablet Take 12.5 mg by mouth daily. 03/03/22   [provider]  hydrocortisone (ANUSOL-HC) 25 MG suppository Place 1 suppository (25 mg total) rectally 2 (two) times daily. Patient not taking: Reported on 03/27/2022 01/27/22   Doree Albee, PA-C  levocetirizine (XYZAL) 5 MG tablet Take 1 tablet (5 mg total) by mouth every evening. Patient not taking: Reported on 03/27/2022 11/07/16   Bobbitt, Heywood Iles, MD  meloxicam (MOBIC) 15 MG tablet Take 15 mg by mouth daily. Patient not taking: Reported on 03/27/2022 03/23/22   [provider]  meloxicam (MOBIC) 7.5 MG tablet Take 1 tablet (7.5 mg total) by mouth daily. Patient not taking: Reported on 03/27/2022 10/23/17   Wallis Bamberg, PA-C  Multiple Vitamin (MULTIVITAMIN) capsule Take 1 capsule by mouth daily.    [provider]  pantoprazole (PROTONIX) 40 MG tablet Take 1 tablet (40 mg total) by mouth daily. 03/27/22   Long, Arlyss Repress, MD  predniSONE (DELTASONE) 20 MG tablet Take by mouth. Patient not taking: Reported on 03/27/2022 03/09/22   [provider]  VITAMIN D, ERGOCALCIFEROL, PO Take 1 tablet by mouth daily.    [provider]      Allergies    Other  Review of Systems   Review of Systems  Constitutional:  Negative for fever.  Respiratory:  Negative for cough and shortness of breath.   Cardiovascular:  Positive for chest pain. Negative for leg swelling.  Gastrointestinal:  Negative for abdominal pain, nausea and vomiting.  Neurological:  Positive for dizziness. Negative for syncope, weakness and numbness.  All other systems reviewed and are negative.   Physical Exam Updated Vital Signs BP 115/63   Pulse (!) 57   Temp 98 F (36.7 C)   Resp 15   SpO2 100%  Physical  Exam Vitals and nursing note reviewed.  Constitutional:      General: She is not in acute distress.    Appearance: Normal appearance. She is not ill-appearing, toxic-appearing or diaphoretic.  HENT:     Head: Normocephalic and atraumatic.     Nose: Nose normal.     Mouth/Throat:     Mouth: Mucous membranes are moist.     Pharynx: Oropharynx is clear.  Eyes:     Extraocular Movements: Extraocular movements intact.     Conjunctiva/sclera: Conjunctivae normal.     Pupils: Pupils are equal, round, and reactive to light.  Cardiovascular:     Rate and Rhythm: Normal rate and regular rhythm.  Pulmonary:     Effort: Pulmonary effort is normal.     Breath sounds: Normal breath sounds. No wheezing.  Abdominal:     General: Abdomen is flat. Bowel sounds are normal.     Palpations: Abdomen is soft.     Tenderness: There is no abdominal tenderness.  Musculoskeletal:     Cervical back: Normal range of motion and neck supple. No tenderness.     Right lower leg: No edema.     Left lower leg: No edema.  Skin:    General: Skin is warm and dry.     Capillary Refill: Capillary refill takes less than 2 seconds.  Neurological:     Mental Status: She is alert and oriented to person, place, and time.     ED Results / Procedures / Treatments   Labs (all labs ordered are listed, but only abnormal results are displayed) Labs Reviewed  BASIC METABOLIC PANEL - Abnormal; Notable for the following components:      Result Value   Sodium 134 (*)    Calcium 8.8 (*)    All other components within normal limits  CBC - Abnormal; Notable for the following components:   RBC 6.21 (*)    MCV 69.6 (*)    MCH 21.6 (*)    All other components within normal limits  D-DIMER, QUANTITATIVE  TROPONIN I (HIGH SENSITIVITY)  TROPONIN I (HIGH SENSITIVITY)     EKG EKG Interpretation Date/Time:  Thursday April 27 2023 16:56:21 EDT Ventricular Rate:  55 PR Interval:  151 QRS Duration:  94 QT Interval:  407 QTC  Calculation: 390 R Axis:   28  Text Interpretation: Sinus rhythm since last tracing no significant change Confirmed by Rolan Bucco [phone]) on 04/27/2023 4:57:49 PM  Radiology DG Chest Port 1 View  Result Date: 04/27/2023 CLINICAL DATA:  Chest discomfort EXAM: PORTABLE CHEST 1 VIEW COMPARISON:  Radiographs 06/12/2022 FINDINGS: Low lung volumes accentuate cardiomediastinal silhouette and pulmonary vascularity. The lungs are clear. No displaced rib fractures. IMPRESSION: No active disease. Electronically Signed   By: Minerva Fester M.D.   On: 04/27/2023 18:20    Procedures Procedures   Medications Ordered in ED Medications  alum & mag hydroxide-simeth (MAALOX/MYLANTA) 200-200-20 MG/5ML suspension  30 mL (30 mLs Oral Given 04/27/23 1740)  pantoprazole (PROTONIX) EC tablet 40 mg (40 mg Oral Given 04/27/23 1740)    ED Course/ Medical Decision Making/ A&P Clinical Course as of 04/27/23 1900  Thu Apr 27, 2023  1805 Discussed Vtach with cardiology who does not believe it is true [place], he favors PVCs. Chandrasekhar. Attending Dr. Fredderick Phenix would feel more comfortable having patient admitted, cardiology has agreed to consult while patient admitted. [CG]    Clinical Course User Index [CG] Al Decant, PA-C    Medical Decision Making Amount and/or Complexity of Data Reviewed Labs: ordered. Radiology: ordered.  Risk OTC drugs. Prescription drug management.   47 year old female presents to ED for evaluation.  Please see HPI for further details.  On examination the patient is afebrile, nontachycardic.  Lung sounds are clear bilaterally and she is nontoxic.  Her abdomen is soft and compressible throughout.  Neurological examination at baseline.  No peripheral edema bilaterally lower extremities.  Patient CBC shows no leukocytosis or anemia.  Metabolic panel shows sodium 134, no other electrolyte derangement, creatinine not elevated, anion gap of 8.  Patient D-dimer not elevated at 0.49  so doubt PE.  Patient initial troponin 5, delta pending at this time.  Chest x-ray unremarkable.  EKG nonischemic.  During patient workup, paramedic advised me that the patient had run of V. tach.  This rhythm strip was printed off and uploaded to patient chart.  Does show 6 runs of V. tach.  Discussed these findings with on-call cardiology Dr. Izora Ribas who favors these to represent PVCs as opposed to V. tach.  He is advised that if patient workup unremarkable, she can be referred to cardiology with ambulatory referral and given heart monitor.  Discussed this with Dr. Fredderick Phenix who does not feel comfortable with this plan.  Dr. Fredderick Phenix favors admitting the patient for observation and cardiology consult, echo, heart monitor.  Reached back out to Dr. Izora Ribas who has agreed to consult on patient while admitted.  Patient is agreeable to this plan.  Patient will have delta troponin drawn and then be admitted.  She will be signed out to oncoming team pending delta troponin.   Final Clinical Impression(s) / ED Diagnoses Final diagnoses:  Atypical chest pain    Rx / DC Orders ED Discharge Orders     None         Al Decant, PA-C 04/27/23 1900    Rolan Bucco, MD 04/27/23 2336

## 2023-04-27 NOTE — ED Provider Notes (Signed)
Care assumed from Jannifer Hick, PA-C at shift change. Please see their note for further information.   Briefly: Patient presents today with complaints of chest pain 2 hours PTA today. Initially thought this was GERD but then it persisted. No cardiac history.   Plan: Labs benign, delta trop pending. Patient with some discomfort but no overt pain. She did have a short run of rhythm change, concern for v tach, however Dr. Izora Ribas with cardiology was consulted and felt these were more likely PVCs and patient could be discharged with a heart monitor and close follow-up.  However, it appears that the patient was more symptomatic with this rhythm change, and therefore ER attending Dr. Malcolm Metro felt patient should be admitted for observation. Patient is understanding and in agreement with this plan. After delta trop results, plan to consult for admission.   8:30PM: Delta troponin has resulted and is 5. Consult placed for admission.  Discussed patient with hospitalist who agrees to admit.    This is a shared visit with supervising physician Dr. Fredderick Phenix who has independently evaluated patient & provided guidance in evaluation/management/disposition, in agreement with care     Vear Clock 04/27/23 2139    Rolan Bucco, MD 04/27/23 2336

## 2023-04-27 NOTE — ED Notes (Signed)
XR at bedside

## 2023-04-27 NOTE — Hospital Course (Signed)
Renee Avery is a 47 y.o. female with medical history significant for HTN, GERD who is admitted for chest pain evaluation.

## 2023-04-28 ENCOUNTER — Other Ambulatory Visit: Payer: Self-pay

## 2023-04-28 ENCOUNTER — Observation Stay (HOSPITAL_BASED_OUTPATIENT_CLINIC_OR_DEPARTMENT_OTHER): Payer: BC Managed Care – PPO

## 2023-04-28 ENCOUNTER — Other Ambulatory Visit: Payer: Self-pay | Admitting: Student

## 2023-04-28 DIAGNOSIS — R0789 Other chest pain: Secondary | ICD-10-CM | POA: Diagnosis not present

## 2023-04-28 DIAGNOSIS — I10 Essential (primary) hypertension: Secondary | ICD-10-CM | POA: Diagnosis not present

## 2023-04-28 DIAGNOSIS — Z79899 Other long term (current) drug therapy: Secondary | ICD-10-CM | POA: Diagnosis not present

## 2023-04-28 DIAGNOSIS — R079 Chest pain, unspecified: Secondary | ICD-10-CM | POA: Diagnosis not present

## 2023-04-28 DIAGNOSIS — Z87891 Personal history of nicotine dependence: Secondary | ICD-10-CM | POA: Diagnosis not present

## 2023-04-28 DIAGNOSIS — R011 Cardiac murmur, unspecified: Secondary | ICD-10-CM

## 2023-04-28 LAB — ECHOCARDIOGRAM COMPLETE
AR max vel: 2.05 cm2
AV Area VTI: 1.92 cm2
AV Area mean vel: 1.96 cm2
AV Mean grad: 5.5 mmHg
AV Peak grad: 9.7 mmHg
Ao pk vel: 1.56 m/s
Area-P 1/2: 2.87 cm2
Calc EF: 54.8 %
Height: 62.5 in
MV VTI: 1.72 cm2
S' Lateral: 3 cm
Single Plane A2C EF: 58 %
Single Plane A4C EF: 51.1 %
Weight: 3408 oz

## 2023-04-28 LAB — BASIC METABOLIC PANEL
Anion gap: 9 (ref 5–15)
BUN: 10 mg/dL (ref 6–20)
CO2: 24 mmol/L (ref 22–32)
Calcium: 8.5 mg/dL — ABNORMAL LOW (ref 8.9–10.3)
Chloride: 102 mmol/L (ref 98–111)
Creatinine, Ser: 0.73 mg/dL (ref 0.44–1.00)
GFR, Estimated: >60 mL/min (ref >60)
Glucose, Bld: 90 mg/dL (ref 70–99)
Potassium: 3.4 mmol/L — ABNORMAL LOW (ref 3.5–5.1)
Sodium: 135 mmol/L (ref 135–145)

## 2023-04-28 LAB — CBC
HCT: 40.2 % (ref 36.0–46.0)
Hemoglobin: 12.1 g/dL (ref 12.0–15.0)
MCH: 21.6 pg — ABNORMAL LOW (ref 26.0–34.0)
MCHC: 30.1 g/dL (ref 30.0–36.0)
MCV: 71.9 fL — ABNORMAL LOW (ref 80.0–100.0)
Platelets: 295 10*3/uL (ref 150–400)
RBC: 5.59 MIL/uL — ABNORMAL HIGH (ref 3.87–5.11)
RDW: 14.6 % (ref 11.5–15.5)
WBC: 7 10*3/uL (ref 4.0–10.5)
nRBC: 0 % (ref 0.0–0.2)

## 2023-04-28 LAB — HIV ANTIBODY (ROUTINE TESTING W REFLEX): HIV Screen 4th Generation wRfx: NONREACTIVE

## 2023-04-28 NOTE — TOC Progression Note (Signed)
Transition of Care [facility]) - Progression Note    Patient Details  Name: Renee Avery MRN: [id number] Date of Birth: April 10, 1976  Transition of Care [facility]) CM/SW Contact  Geni Bers, RN Phone Number: 04/28/2023, 12:02 PM  Clinical Narrative:       Transition of Care (TOC) Screening Note   Patient Details  Name: Renee Avery Date of Birth: 1976-03-07   Transition of Care [facility]) CM/SW Contact:    Geni Bers, RN Phone Number: 04/28/2023, 12:03 PM    Transition of Care Department [facility]) has reviewed patient and no TOC needs have been identified at this time. We will continue to monitor patient advancement through interdisciplinary progression rounds. If new patient transition needs arise, please place a TOC consult.        Expected Discharge Plan and Services         Expected Discharge Date: 04/28/23                                     Social Determinants of Health (SDOH) Interventions SDOH Screenings   Food Insecurity: Food Insecurity Present (04/27/2023)  Housing: Low Risk  (04/27/2023)  Transportation Needs: No Transportation Needs (04/27/2023)  Utilities: Not At Risk (04/27/2023)  Financial Resource Strain: Low Risk  (11/30/2022)   Received from [facility], [facility]  Physical Activity: Insufficiently Active (11/30/2022)   Received from [facility], [facility]  Social Connections: Socially Integrated (11/30/2022)   Received from [facility], [facility]  Stress: Stress Concern Present (11/30/2022)   Received from [facility], [facility]  Tobacco Use: Medium Risk (04/27/2023)    Readmission Risk Interventions     No data to display

## 2023-04-28 NOTE — Consult Note (Addendum)
Cardiology Consultation   Patient ID: Renee Avery MRN: [id number]; DOB: 1976/06/17  Admit date: 04/27/2023 Date of Consult: 04/28/2023  PCP:  Medicine, [facility]   [facility] Providers Cardiologist:  New (Dr. Izora Ribas)  Patient Profile:   Renee Avery is a 47 y.o. female with a history of lower extremity edema, prediabetes, GERD, and obesity who is being seen 04/28/2023 for the evaluation of PVCs and atypical chest pain at the request of Dr. Fredderick Phenix.  History of Present Illness:   Renee Avery is a 47 year old female with the above history. No prior cardiac history. She states her cholesterol has been borderline in the past and she was told to focus on diet changes. She does take HCTZ at home but states this is for lower extremity edema not hypertension.   Patient presented to the [facility] ED on 04/27/2023 for further evaluation of atypical chest pain. Patient reports she was driving yesterday when she developed a sudden onset of chest discomfort that she describes as a warm sensation located on the left side of her chest. It radiated up the left side of her neck. She does have a history of reflux and states she had a similar episode about a year ago that was felt to be reflux. However, given pain was not in the center of her chest, she was worried it may be her heart and drove to the ED. She denies any other recent chest pain. No significant shortness of breath. No orthopnea or PND. Her lower extremity edema is well controlled on the HCTZ. She also uses compression stockings which help. She reports feeling a little lightheaded yesterday prior to onset of chest discomfort because she had not eaten anything. However, no other dizziness. No syncope. No palpitations other than the brief flutter sensation in the ED. No recent fevers or illnesses. She has occasional blood in her stools when she is more constipated (sounds like she has been told she has  hemorrhoids in the past). She had a colonoscopy last year. No other abnormal bleeding.  Upon arrival to the ED, BP soft at times but vitals stable. EKG showed sinus bradycardia, rate 55 bpm, with no acute ST/ T wave changes. High-sensitivity troponin negative x2 (5 >> 5). D-dimer negative. Chest x-ray showed no acute findings. WBC 7.7, Hgb 13.4, Plts 308. Na 134, K 3.6, Glucose 96, BUN 11, Cr 0.73.  Chest discomfort ultimately resolved with Protonix and GI cocktail in the ED (lasted a couple of hours in tolal). ED provider was concerned that telemetry showed a short run of NSVT but upon review by Cardiology was felt to be a PVC and then artifact. Cardiology recommended an outpatient cardiac monitor but felt she was stable for discharged. ED provider felt uncomfortable with this. Therefore, she was admitted to [facility] under Internal Medicine service and Cardiology formally consulted.  In regards to the possible short run of NSVT in the ED, patient states she was getting blood drawn at the time which she does not like and felt a weird sensation in her chest. However, no sustained/ prolonged palpitations.  She has a remote smoking history - quit 15 years ago. No significant alcohol use (maybe 3-4 drinks per month). No recreational drug use. She does have a family history of heart disease with her father dying from a MI at the age of 7. She also thinks her mother may have some heart disease but is not sure exactly what.  Past Medical  History:  Diagnosis Date   Allergic urticaria    Allergy    bug bite; hives   Anemia    DDD (degenerative disc disease), cervical    Eczema    Hemorrhoid    Obesity    Prediabetes    Spondylolysis of cervical region    Vitamin D deficiency     Past Surgical History:  Procedure Laterality Date   uterine biopsy     benign     Home Medications:  Prior to Admission medications   Medication Sig Start Date End Date Taking? Authorizing Provider  cetirizine  (ZYRTEC) 10 MG tablet Take 10 mg by mouth daily.    [provider]  cyclobenzaprine (FLEXERIL) 5 MG tablet Take 1 tablet (5 mg total) by mouth 3 (three) times daily as needed for muscle spasms. Patient not taking: Reported on 03/27/2022 10/23/17   Wallis Bamberg, PA-C  EPINEPHrine 0.3 mg/0.3 mL IJ SOAJ injection Inject 0.3 mg into the muscle as needed for anaphylaxis. 03/27/23   Rondel Baton, MD  famotidine (PEPCID) 40 MG tablet Take 1 tablet (40 mg total) by mouth daily. 01/27/22 04/27/22  Doree Albee, PA-C  ferrous sulfate 325 (65 FE) MG EC tablet Take 325 mg by mouth daily.     [provider]  hydrochlorothiazide (HYDRODIURIL) 12.5 MG tablet Take 12.5 mg by mouth daily. 03/03/22   [provider]  Multiple Vitamin (MULTIVITAMIN) capsule Take 1 capsule by mouth daily.    [provider]  pantoprazole (PROTONIX) 40 MG tablet Take 1 tablet (40 mg total) by mouth daily. 03/27/22   Long, Arlyss Repress, MD  VITAMIN D, ERGOCALCIFEROL, PO Take 1 tablet by mouth daily.    [provider]    Inpatient Medications: Scheduled Meds:  enoxaparin (LOVENOX) injection  40 mg Subcutaneous Q24H   famotidine  40 mg Oral Daily   hydrochlorothiazide  12.5 mg Oral Daily   sodium chloride flush  3 mL Intravenous Q12H   Continuous Infusions:  PRN Meds: acetaminophen **OR** acetaminophen, ondansetron **OR** ondansetron (ZOFRAN) IV, senna-docusate  Allergies:    Allergies  Allergen Reactions   Other Anaphylaxis    Bug bites    Social History:   Social History   Socioeconomic History   Marital status: Single    Spouse name: Not on file   Number of children: 0   Years of education: Not on file   Highest education level: Not on file  Occupational History   Occupation: Air traffic controller  Tobacco Use   Smoking status: Former    Types: Cigarettes   Smokeless tobacco: Never  Vaping Use   Vaping status: Never Used  Substance and Sexual Activity   Alcohol  use: Yes    Comment: social   Drug use: No   Sexual activity: Yes  Other Topics Concern   Not on file  Social History Narrative   Not on file   Social Determinants of Health   Financial Resource Strain: Low Risk  (11/30/2022)   Received from [facility], [facility]   Overall Financial Resource Strain (CARDIA)    Difficulty of Paying Living Expenses: Not very hard  Food Insecurity: Food Insecurity Present (04/27/2023)   Hunger Vital Sign    Worried About Running Out of Food in the Last Year: Sometimes true    Ran Out of Food in the Last Year: Never true  Transportation Needs: No Transportation Needs (04/27/2023)   PRAPARE - Administrator, Civil Service (Medical):  No    Lack of Transportation (Non-Medical): No  Physical Activity: Insufficiently Active (11/30/2022)   Received from [facility], [facility]   Exercise Vital Sign    Days of Exercise per Week: 4 days    Minutes of Exercise per Session: 30 min  Stress: Stress Concern Present (11/30/2022)   Received from [facility], [facility] - Occupational Stress Questionnaire    Feeling of Stress : To some extent  Social Connections: Socially Integrated (11/30/2022)   Received from [facility], [facility]   Social Network    How would you rate your social network (family, work, friends)?: Good participation with social networks  Intimate Partner Violence: Not At Risk (04/27/2023)   Humiliation, Afraid, Rape, and Kick questionnaire    Fear of Current or Ex-Partner: No    Emotionally Abused: No    Physically Abused: No    Sexually Abused: No    Family History:   Family History  Problem Relation Age of Onset   Colon cancer Mother        in her 17s   Colon polyps Mother    Heart attack Father    Asthma Sister    Eczema Sister    Food Allergy Sister        shellfish, chocolate   Eczema Sister    Allergic rhinitis Neg Hx    Angioedema Neg Hx    Stomach  cancer Neg Hx    Esophageal cancer Neg Hx    Pancreatic cancer Neg Hx      ROS:  Please see the history of present illness.  Review of Systems  Constitutional:  Negative for chills and fever.  HENT:  Negative for congestion.   Respiratory:  Positive for shortness of breath.   Cardiovascular:  Positive for chest pain, palpitations (brief flutter sensation) and leg swelling. Negative for orthopnea and PND.  Gastrointestinal:  Positive for blood in stool. Negative for melena, nausea and vomiting.  Genitourinary:  Negative for hematuria.  Musculoskeletal:  Negative for myalgias.  Neurological:  Negative for dizziness and loss of consciousness.  Psychiatric/Behavioral:  Negative for substance abuse (remote smoking history).     Physical Exam/Data:   Vitals:   04/27/23 2145 04/27/23 2200 04/27/23 2254 04/28/23 0306  BP:  131/72 (!) 146/82 129/68  Pulse:  (!) 51  60  Resp:  16 18   Temp: 98 F (36.7 C)  98.5 F (36.9 C) 97.6 F (36.4 C)  TempSrc: Oral  Oral Oral  SpO2:  100% 100% 100%  Weight:   96.6 kg   Height:   5' 2.5" (1.588 m)     Intake/Output Summary (Last 24 hours) at 04/28/2023 0910 Last data filed at 04/28/2023 0100 Gross per 24 hour  Intake 240 ml  Output --  Net 240 ml      04/27/2023   10:54 PM 03/27/2023    9:00 PM 06/12/2022    5:04 PM  Last 3 Weights  Weight (lbs) 213 lb 214 lb 220 lb  Weight (kg) 96.616 kg 97.07 kg 99.791 kg     Body mass index is 38.34 kg/m.  General: 47 y.o. female resting comfortably in no acute distress. HEENT: Normocephalic and atraumatic. Sclera clear. EOMs intact. Neck: Supple. No carotid bruits. No JVD. Heart: RRR. Distinct S1 and S2. No murmurs, gallops, or rubs. Radial and distal pedal pulses 2+ and equal bilaterally. Lungs: No increased work of breathing. Clear to ausculation bilaterally. No wheezes,  rhonchi, or rales.  Abdomen: Soft, non-distended, and non-tender to palpation. Bowel sounds present in all 4 quadrants.  MSK:  Normal strength and tone for age. Extremities: No clubbing, cyanosis, or edema.    Skin: Warm and dry. Neuro: Alert and oriented x3. No focal deficits. Psych: Normal affect. Responds appropriately.   EKG:  The EKG was personally reviewed and demonstrates:  Sinus bradycardia, rate 55 bpm, with no acute ST/ T wave changes. Telemetry:  Telemetry was personally reviewed and demonstrates:  Sinus rhythm with baseline rates in the 50s.   Relevant CV Studies:  N/A. Echo pending.  Laboratory Data:  High Sensitivity Troponin:   Recent Labs  Lab 04/27/23 1747 04/27/23 1930  TROPONINIHS 5 5     Chemistry Recent Labs  Lab 04/27/23 1747  NA 134*  K 3.6  CL 100  CO2 26  GLUCOSE 96  BUN 11  CREATININE 0.73  CALCIUM 8.8*  GFRNONAA >60  ANIONGAP 8    No results for input(s): "PROT", "ALBUMIN", "AST", "ALT", "ALKPHOS", "BILITOT" in the last 168 hours. Lipids No results for input(s): "CHOL", "TRIG", "HDL", "LABVLDL", "LDLCALC", "CHOLHDL" in the last 168 hours.  Hematology Recent Labs  Lab 04/27/23 1747 04/28/23 0546  WBC 7.7 7.0  RBC 6.21* 5.59*  HGB 13.4 12.1  HCT 43.2 40.2  MCV 69.6* 71.9*  MCH 21.6* 21.6*  MCHC 31.0 30.1  RDW 14.7 14.6  PLT 308 295   Thyroid No results for input(s): "TSH", "FREET4" in the last 168 hours.  BNPNo results for input(s): "BNP", "PROBNP" in the last 168 hours.  DDimer  Recent Labs  Lab 04/27/23 1747  DDIMER 0.49     Radiology/Studies:  DG Chest Port 1 View  Result Date: 04/27/2023 CLINICAL DATA:  Chest discomfort EXAM: PORTABLE CHEST 1 VIEW COMPARISON:  Radiographs 06/12/2022 FINDINGS: Low lung volumes accentuate cardiomediastinal silhouette and pulmonary vascularity. The lungs are clear. No displaced rib fractures. IMPRESSION: No active disease. Electronically Signed   By: Minerva Fester M.D.   On: 04/27/2023 18:20     Assessment and Plan:   Atypical Chest Pain Patient presented to the ED for further evaluation of chest discomfort  that she described as a warmness on the left side of her chest that radiated up to her neck. EKG shows no acute ischemic changes and high-sensitivity troponin negative x2. Symptoms ultimately resolved with Protonix and GI cocktail. She does have a history of reflux. Symptoms sound atypical and possible GI related. However, she does have a family history of heart disease with father dying from a MI at 54. Could consider outpatient coronary CTA but do not think any additional inpatient work-up is necessary.   Murmur She does have a soft systolic murmur on exam. She states she has been told she has a murmur before. Recommend Echo - this does not necessarily need to be done inpatient.   PVC ED provider was concerned that patient had a short run of NSVT. However, it looks like a PVCs followed by artifact. Patient states this occurred while she was getting her blood drawn which she does not like. She describes a brief flutter sensation with this but no other reports of palpitations. No recurrent ectopy noted on telemetry. Can consider outpatient monitor if she has recurrent palpitations.   Lower Extremity Edema She states she has a history of lower extremity edema for which she takes HCTZ 12.5mg  daily. OK to continue. No edema on exam.  Risk Assessment/Risk Scores:    For questions or  updates, please contact [facility] Please consult [email or web address] for contact info under    Signed, Corrin Parker, PA-C  04/28/2023 9:10 AM  Personally seen and examined. Agree with APP above with the following comments: 47 yo F with signs and symptoms of atypical chest pain.  She lives alone, her father had an MI in the past at age 88. - ED work up notable for normal troponin. - There is a telemetry strep from ~ 1700.  Personally reviewed with ED staff; this was not VT.  PVC followed by artifact. Reviewed this episode with her.  She was not symptomatic.  Patient notes no CP, SOB, Palpitations  presently.    Exam notable for holosystolic murmur.  Otherwise as above.  EKG Sinus birthday  Would recommend  - non cardiac CP with risk factors, CP now resolved - Rare PVC that was asymptomatic, without VT - holosystolic murmur- due to the chronicity of this and the global [employer] technology outage, will elect todo this as outpatient - as outpatient, if recurrent CP, CCTA, if not CAC score and Lp(a) - mid August f/u with me or Vernie Shanks, MD FASE [facility] Cardiologist [facility]  [address] [place], [address] [place], [place] [address] [phone]  9:46 AM

## 2023-04-28 NOTE — Progress Notes (Signed)
Ordered outpatient for further evaluation of murmur. Please see consult note from today for more information.   Corrin Parker, PA-C 04/28/2023 3:11 PM

## 2023-04-28 NOTE — Plan of Care (Signed)
  Problem: Education: Goal: Knowledge of General Education information will improve Description Including pain rating scale, medication(s)/side effects and non-pharmacologic comfort measures Outcome: Progressing   

## 2023-04-28 NOTE — Plan of Care (Signed)
  Problem: Education: Goal: Knowledge of General Education information will improve Description: Including pain rating scale, medication(s)/side effects and non-pharmacologic comfort measures 04/28/2023 1132 by Shirleen Schirmer, RN Outcome: Adequate for Discharge 04/28/2023 1035 by Shirleen Schirmer, RN Outcome: Progressing   Problem: Health Behavior/Discharge Planning: Goal: Ability to manage health-related needs will improve 04/28/2023 1132 by Shirleen Schirmer, RN Outcome: Adequate for Discharge 04/28/2023 1035 by Shirleen Schirmer, RN Outcome: Progressing   Problem: Clinical Measurements: Goal: Ability to maintain clinical measurements within normal limits will improve 04/28/2023 1132 by Shirleen Schirmer, RN Outcome: Adequate for Discharge 04/28/2023 1035 by Shirleen Schirmer, RN Outcome: Progressing Goal: Will remain free from infection 04/28/2023 1132 by Shirleen Schirmer, RN Outcome: Adequate for Discharge 04/28/2023 1035 by Shirleen Schirmer, RN Outcome: Progressing Goal: Diagnostic test results will improve 04/28/2023 1132 by Shirleen Schirmer, RN Outcome: Adequate for Discharge 04/28/2023 1035 by Shirleen Schirmer, RN Outcome: Progressing Goal: Respiratory complications will improve 04/28/2023 1132 by Shirleen Schirmer, RN Outcome: Adequate for Discharge 04/28/2023 1035 by Shirleen Schirmer, RN Outcome: Progressing Goal: Cardiovascular complication will be avoided 04/28/2023 1132 by Shirleen Schirmer, RN Outcome: Adequate for Discharge 04/28/2023 1035 by Shirleen Schirmer, RN Outcome: Progressing

## 2023-04-28 NOTE — Progress Notes (Signed)
Patient discharged home. Discharge instructions given and explained to patient, she verbalized understanding. Patient denies any pain or distress.  Accompanied home by family.

## 2023-04-28 NOTE — Discharge Summary (Signed)
Physician Discharge Summary  Renee Avery ZOX:[id number] DOB: Nov 13, 1975 DOA: 04/27/2023  PCP: Medicine, [facility]  Admit date: 04/27/2023 Discharge date: 04/28/2023    Admitted From: home Disposition:  home  Recommendations for Outpatient Follow-up:  Follow up with PCP in 1-2 weeks Please obtain BMP/CBC in one week Please follow up with your PCP on the following pending results: Unresulted Labs (From admission, onward)     Start     Ordered   04/28/23 0500  HIV Antibody (routine testing w rflx)  (HIV Antibody (Routine testing w reflex) panel)  Tomorrow morning,   R        04/27/23 2328   04/28/23 0500  Basic metabolic panel  Tomorrow morning,   R        04/27/23 2328              Home Health:none  Equipment/Devices:none   Discharge Condition:stable  CODE STATUS:full code   Diet recommendation: regular  HPI: Kennedee Kitzmiller is a 47 y.o. female with medical history significant for HTN, GERD who presented to the ED for evaluation of chest discomfort.   Patient states that she was driving from downtown [place] when she developed a warm sensation in her left upper chest area.  Initially she thought these were related to her reflux symptoms as she felt like she had to burp however symptoms persisted.  She noted some radiation to her left neck and lower face.  There was no radiation to her arms or back.  She says symptoms were not necessarily painful, she just knew that something was off and drove to the ED for further evaluation.   She reports a somewhat similar episode about 1 year ago.  She says she was seen in the hospital and subsequently diagnosed as symptoms being related to heartburn.   Patient states that her father died of a heart attack at age 74.  She denies tobacco use.  She reports occasional ibuprofen use, last use about 1 week ago.   [facility] ED Course  Labs/Imaging on admission: I have personally reviewed following labs and  imaging studies.   Initial vitals showed BP 135/78, pulse 68, RR 16, temp 98.0 F, SpO2 100% on room air.   Labs show WBC 7.7, hemoglobin 13.4, platelets 308,000, sodium 134, potassium 3.6, bicarb 26, BUN 11, creatinine 0.73, serum glucose 96, D-dimer 0.49, troponin 5 x 2.   Portable chest x-ray negative for focal consolidation, edema, effusion.   Patient was given oral Protonix and Maalox.   While in the ED patient had nonsustained 6 beat run of V. tach.  EDPA discussed with cardiology Dr. Izora Ribas who favored these to represent PVCs as opposed to V. tach and felt patient could potentially be discharged to home if remaining workup unremarkable.  ED attending however felt patient should be admitted for further workup.  They discussed again with Dr. Izora Ribas who agreed to consult on patient while admitted.  The hospitalist service was consulted to admit for further evaluation and management.    Subjective:seen and examined. Feels better. No chest pain.   Brief/Interim Summary: admitted for atypical chest pain which has now resolved. She is ruled out of ACS. Seen and cleared by cards. Likely sx were secondary to GERD. She was advised meds and lifestyle modifications to deal with GERD. Card recd outpatient CT coronary if desired by pt. She is being discharged in stable condition.   Discharge plan was discussed with patient and/or family member and they  verbalized understanding and agreed with it.  Discharge Diagnoses:  Principal Problem:   Atypical chest pain Active Problems:   GERD (gastroesophageal reflux disease)    Discharge Instructions   Allergies as of 04/28/2023       Reactions   Other Anaphylaxis   Bug bites        Medication List     STOP taking these medications    cyclobenzaprine 5 MG tablet Commonly known as: FLEXERIL       TAKE these medications    cetirizine 10 MG tablet Commonly known as: ZYRTEC Take 10 mg by mouth daily.   EPINEPHrine 0.3  mg/0.3 mL Soaj injection Commonly known as: EPI-PEN Inject 0.3 mg into the muscle as needed for anaphylaxis.   famotidine 40 MG tablet Commonly known as: PEPCID Take 1 tablet (40 mg total) by mouth daily.   ferrous sulfate 325 (65 FE) MG EC tablet Take 325 mg by mouth daily.   hydrochlorothiazide 12.5 MG tablet Commonly known as: HYDRODIURIL Take 12.5 mg by mouth daily.   multivitamin capsule Take 1 capsule by mouth daily.   pantoprazole 40 MG tablet Commonly known as: Protonix Take 1 tablet (40 mg total) by mouth daily.   VITAMIN D (ERGOCALCIFEROL) PO Take 1 tablet by mouth daily.        Follow-up Information     Medicine, [facility] Follow up in 1 week(s).   Specialty: Family Medicine Contact information: [address] [place] [address] [phone]                Allergies  Allergen Reactions   Other Anaphylaxis    Bug bites    Consultations: cards   Procedures/Studies: DG Chest Port 1 View  Result Date: 04/27/2023 CLINICAL DATA:  Chest discomfort EXAM: PORTABLE CHEST 1 VIEW COMPARISON:  Radiographs 06/12/2022 FINDINGS: Low lung volumes accentuate cardiomediastinal silhouette and pulmonary vascularity. The lungs are clear. No displaced rib fractures. IMPRESSION: No active disease. Electronically Signed   By: Minerva Fester M.D.   On: 04/27/2023 18:20     Discharge Exam: Vitals:   04/27/23 2254 04/28/23 0306  BP: (!) 146/82 129/68  Pulse:  60  Resp: 18   Temp: 98.5 F (36.9 C) 97.6 F (36.4 C)  SpO2: 100% 100%   Vitals:   04/27/23 2145 04/27/23 2200 04/27/23 2254 04/28/23 0306  BP:  131/72 (!) 146/82 129/68  Pulse:  (!) 51  60  Resp:  16 18   Temp: 98 F (36.7 C)  98.5 F (36.9 C) 97.6 F (36.4 C)  TempSrc: Oral  Oral Oral  SpO2:  100% 100% 100%  Weight:   96.6 kg   Height:   5' 2.5" (1.588 m)     General: Pt is alert, awake, not in acute distress Cardiovascular: RRR, S1/S2 +, no rubs,  no gallops Respiratory: CTA bilaterally, no wheezing, no rhonchi Abdominal: Soft, NT, ND, bowel sounds + Extremities: no edema, no cyanosis    The results of significant diagnostics from this hospitalization (including imaging, microbiology, ancillary and laboratory) are listed below for reference.     Microbiology: No results found for this or any previous visit (from the past 240 hour(s)).   Labs: BNP (last 3 results) No results for input(s): "BNP" in the last 8760 hours. Basic Metabolic Panel: Recent Labs  Lab 04/27/23 1747  NA 134*  K 3.6  CL 100  CO2 26  GLUCOSE 96  BUN 11  CREATININE 0.73  CALCIUM 8.8*   Liver Function Tests: No results for input(s): "AST", "ALT", "ALKPHOS", "BILITOT", "PROT", "ALBUMIN" in the last 168 hours. No results for input(s): "LIPASE", "AMYLASE" in the last 168 hours. No results for input(s): "AMMONIA" in the last 168 hours. CBC: Recent Labs  Lab 04/27/23 1747 04/28/23 0546  WBC 7.7 7.0  HGB 13.4 12.1  HCT 43.2 40.2  MCV 69.6* 71.9*  PLT 308 295   Cardiac Enzymes: No results for input(s): "CKTOTAL", "CKMB", "CKMBINDEX", "TROPONINI" in the last 168 hours. BNP: Invalid input(s): "POCBNP" CBG: No results for input(s): "GLUCAP" in the last 168 hours. D-Dimer Recent Labs    04/27/23 1747  DDIMER 0.49   Hgb A1c No results for input(s): "HGBA1C" in the last 72 hours. Lipid Profile No results for input(s): "CHOL", "HDL", "LDLCALC", "TRIG", "CHOLHDL", "LDLDIRECT" in the last 72 hours. Thyroid function studies No results for input(s): "TSH", "T4TOTAL", "T3FREE", "THYROIDAB" in the last 72 hours.  Invalid input(s): "FREET3" Anemia work up No results for input(s): "VITAMINB12", "FOLATE", "FERRITIN", "TIBC", "IRON", "RETICCTPCT" in the last 72 hours. Urinalysis    Component Value Date/Time   COLORURINE YELLOW (A) 03/13/2016 1656   APPEARANCEUR TURBID (A) 03/13/2016 1656   LABSPEC 1.024 03/13/2016 1656   PHURINE 5.0 03/13/2016  1656   GLUCOSEU NEGATIVE 03/13/2016 1656   HGBUR 1+ (A) 03/13/2016 1656   BILIRUBINUR NEGATIVE 03/13/2016 1656   KETONESUR NEGATIVE 03/13/2016 1656   PROTEINUR 100 (A) 03/13/2016 1656   NITRITE NEGATIVE 03/13/2016 1656   LEUKOCYTESUR NEGATIVE 03/13/2016 1656   Sepsis Labs Recent Labs  Lab 04/27/23 1747 04/28/23 0546  WBC 7.7 7.0   Microbiology No results found for this or any previous visit (from the past 240 hour(s)).  FURTHER DISCHARGE INSTRUCTIONS:   Get Medicines reviewed and adjusted: Please take all your medications with you for your next visit with your Primary MD   Laboratory/radiological data: Please request your Primary MD to go over all hospital tests and procedure/radiological results at the follow up, please ask your Primary MD to get all Hospital records sent to his/her office.   In some cases, they will be blood work, cultures and biopsy results pending at the time of your discharge. Please request that your primary care M.D. goes through all the records of your hospital data and follows up on these results.   Also Note the following: If you experience worsening of your admission symptoms, develop shortness of breath, life threatening emergency, suicidal or homicidal thoughts you must seek medical attention immediately by calling 911 or calling your MD immediately  if symptoms less severe.   You must read complete instructions/literature along with all the possible adverse reactions/side effects for all the Medicines you take and that have been prescribed to you. Take any new Medicines after you have completely understood and accpet all the possible adverse reactions/side effects.    Do not drive when taking Pain medications or sleeping medications (Benzodaizepines)   Do not take more than prescribed Pain, Sleep and Anxiety Medications. It is not advisable to combine anxiety,sleep and pain medications without talking with your primary care practitioner   Special  Instructions: If you have smoked or chewed Tobacco  in the last 2 yrs please stop smoking, stop any regular Alcohol  and or any Recreational drug use.   Wear Seat belts while driving.   Please note: You were cared for by a hospitalist during your hospital stay. Once you are discharged, your primary care physician will handle any further medical  issues. Please note that NO REFILLS for any discharge medications will be authorized once you are discharged, as it is imperative that you return to your primary care physician (or establish a relationship with a primary care physician if you do not have one) for your post hospital discharge needs so that they can reassess your need for medications and monitor your lab values  Time coordinating discharge: Over 30 minutes  SIGNED:   Hughie Closs, MD  [facility] 04/28/2023, 9:18 AM *Please note that this is a verbal dictation therefore any spelling or grammatical errors are due to the "Dragon [employer] One" system interpretation. If 7PM-7AM, please contact night-coverage [email or web address]

## 2023-05-23 NOTE — Progress Notes (Signed)
Cardiology Office Note:    Date:  05/29/2023   ID:  Renee Avery, DOB 1976/03/29, MRN [id number]  PCP:  Medicine, [facility]  Cardiologist:  Christell Constant, MD  Electrophysiologist:  None   Referring MD: [facility]   Chief Complaint: hospital follow-up of atypical chest pain  History of Present Illness:    Renee Avery is a 47 y.o. female with a history of atypical chest pain, lower extremity edema, pre-diabetes, GERD, and obesity who is followed by Dr. Izora Ribas and presents today for hospital follow-up of atypical chest pain.  Patient was first seen by Cardiology during recent hospitalization. She was admitted from 04/27/2023 to 04/28/2023 for atypical chest pain. She was driving when she had a sudden onset of chest discomfort that she described as a warm sensation located on the left side of her chest that radiated up to her neck. EKG showed no acute ischemic changes and high-sensitivity troponin was negative. Symptoms resolved with Protonix and GI cocktail in the ED. While in the ED provider, ED provider was concerned that telemetry showed a short run of NSVT but upon reviewed by Cardiology this was felt to be artifact. Echo showed LVEF of 55-60% with normal wall motion, normal diastolic parameters, and no significant valvular disease.She was seen by Cardiology and plan was for a coronary CTA as an outpatient if she has an recurrent chest pain (if no recurrent symptoms, can proceed with coronary calcium score and lipoprotein (A) instead.   Patient presents today for follow-up. She has had a couple of episodes of recurrent chest discomfort since leaving the hospital. She states they felt similar to the episode that led to her recent hospitalization but not as severe. These episodes usually occur after drinking "black coffee" or other foods such as tomatoes. She is trying to watch what she eats which has helped. She is no longer taking Pepcid due to  elevated AST of 99 recent labs at PCPs office on 05/11/2023. She takes Maalox and Pepto Bismol as needed. No new or worsening shortness of breath. No orthopnea or PND. She has a history of lower extremity edema but this is well controlled and hydrochlorothiazide and she has no edema on exam today. No palpitations. She occasionally has very brief episodes of dizziness if she turns her head quickly or if she stands up to quickly. However, this only last a couple of seconds. No falls or near syncope/ syncope.    EKGs/Labs/Other Studies Reviewed:    The following studies were reviewed:  Echocardiogram 04/28/2023: Impressions: 1. Left ventricular ejection fraction, by estimation, is 55 to 60%. The  left ventricle has normal function. The left ventricle has no regional  wall motion abnormalities. Left ventricular diastolic parameters were  normal.   2. Right ventricular systolic function is normal. The right ventricular  size is normal.   3. The mitral valve is normal in structure. Trivial mitral valve  regurgitation.   4. The aortic valve is normal in structure. Aortic valve regurgitation is  not visualized.   EKG:  EKG not ordered today.   Recent Labs: 06/12/2022: ALT 9 04/28/2023: BUN 10; Creatinine, Ser 0.73; Hemoglobin 12.1; Platelets 295; Potassium 3.4; Sodium 135  Recent Lipid Panel No results found for: "CHOL", "TRIG", "HDL", "CHOLHDL", "VLDL", "LDLCALC", "LDLDIRECT"  Physical Exam:    Vital Signs: BP 118/70   Pulse 61   Ht 5' 2.5" (1.588 m)   Wt 209 lb (94.8 kg)   SpO2 99%   BMI  37.62 kg/m     Wt Readings from Last 3 Encounters:  05/29/23 209 lb (94.8 kg)  04/27/23 213 lb (96.6 kg)  03/27/23 214 lb (97.1 kg)     General: 47 y.o. obese African-American female in no acute distress. HEENT: Normocephalic and atraumatic. Sclera clear.  Neck: Supple. No carotid bruits. No JVD. Heart: RRR. II/VI systolic murmur. No gallops or rubs. Lungs: No increased work of breathing. Clear to  ausculation bilaterally. No wheezes, rhonchi, or rales.  Abdomen: Soft, non-distended, and non-tender to palpation.  Extremities: No lower extremity edema.    Skin: Warm and dry. Neuro: Alert and oriented x3. No focal deficits. Psych: Normal affect. Responds appropriately.  Assessment:    1. Atypical chest pain   2. Dyslipidemia   3. Lower extremity edema     Plan:    Atypical Chest Pain Patient was recently admitted for atypical chest pain. EKG showed no acute ischemic changes and high-sensitivity troponin negative x2. Echo showed normal LV function. Symptoms resolved with Protonix and GI cocktail.  - She has had a couple of episodes of recurrent chest pain since discharge mostly after drinking black coffee or certain foods such as tomatoes/ tomato sauce. - Symptoms sound atypical. Likely related to GERD. However, given recurrent pain, will order coronary CTA to rule out an obstructive CAD. Will check a BMET today. Heart rates typically in the 50s to low 60s so will not prescribe any Lopressor.   Of note, she does have some anxiety and claustrophobia. However, I showed her pictures/ videos of what a coronary CTA looks like and she thinks she will be okay.  Dyslipidemia Lipid panel in 11/2022: Total Cholesterol 182, Triglycerides 76, HDL 55, LDL 113. - ASCVD 10 year risk is 0.9% which is considered low risk so can hold off on a statin at this time unless coronary CTA shows any CAD. - Will check a lipoprotein (a).   Lower Extremity Edema Chronic and stable. - Continue HCTZ 12.5mg  daily.    Disposition: Follow up in 6 months.   Medication Adjustments/Labs and Tests Ordered: Current medicines are reviewed at length with the patient today.  Concerns regarding medicines are outlined above.  Orders Placed This Encounter  Procedures   CT CORONARY MORPH W/CTA COR W/SCORE W/CA W/CM &/OR WO/CM   Basic metabolic panel   Lipoprotein A (LPA)   No orders of the defined types were placed  in this encounter.   Patient Instructions  Medication Instructions:  The current medical regimen is effective;  continue present plan and medications as directed. Please refer to the Current Medication list given to you today.  *If you need a refill on your cardiac medications before your next appointment, please call your pharmacy*  Lab Work: BMET AND LP-A TODAY If you have labs (blood work) drawn today and your tests are completely normal, you will receive your results only by:   MyChart Message (if you have MyChart) OR  A paper copy in the mail If you have any lab test that is abnormal or we need to change your treatment, we will call you to review the results.  Testing/Procedures: Your physician has requested that you have cardiac CT. Cardiac computed tomography (CT) is a painless test that uses an x-ray machine to take clear, detailed pictures of your heart. For further information please visit [email or web address] Please follow instruction sheet as given.-HOLD YOUR HCTZ THE MORNING OF THIS TESTING  Follow-Up: At [facility], you and your health needs are our  priority.  As part of our continuing mission to provide you with exceptional heart care, we have created designated Provider Care Teams.  These Care Teams include your primary Cardiologist (physician) and Advanced Practice Providers (APPs -  Physician Assistants and Nurse Practitioners) who all work together to provide you with the care you need, when you need it.  Your next appointment:   6 month(s)  Provider:   Christell Constant, MD  or Marjie Skiff, PA-C        Other Instructions    Signed, Corrin Parker, PA-C  05/29/2023 10:07 AM    [facility]

## 2023-05-23 NOTE — Addendum Note (Signed)
Addended by: Marjie Skiff on: 05/23/2023 01:08 PM   Modules accepted: Orders

## 2023-05-29 ENCOUNTER — Encounter: Payer: Self-pay | Admitting: Student

## 2023-05-29 ENCOUNTER — Ambulatory Visit: Payer: BC Managed Care – PPO | Attending: Student | Admitting: Student

## 2023-05-29 VITALS — BP 118/70 | HR 61 | Ht 62.5 in | Wt 209.0 lb

## 2023-05-29 DIAGNOSIS — R600 Localized edema: Secondary | ICD-10-CM | POA: Diagnosis not present

## 2023-05-29 DIAGNOSIS — R072 Precordial pain: Secondary | ICD-10-CM | POA: Diagnosis not present

## 2023-05-29 DIAGNOSIS — E785 Hyperlipidemia, unspecified: Secondary | ICD-10-CM | POA: Diagnosis not present

## 2023-05-29 NOTE — Patient Instructions (Signed)
Medication Instructions:  The current medical regimen is effective;  continue present plan and medications as directed. Please refer to the Current Medication list given to you today.  *If you need a refill on your cardiac medications before your next appointment, please call your pharmacy*  Lab Work: BMET AND LP-A TODAY If you have labs (blood work) drawn today and your tests are completely normal, you will receive your results only by:   MyChart Message (if you have MyChart) OR  A paper copy in the mail If you have any lab test that is abnormal or we need to change your treatment, we will call you to review the results.  Testing/Procedures: Your physician has requested that you have cardiac CT. Cardiac computed tomography (CT) is a painless test that uses an x-ray machine to take clear, detailed pictures of your heart. For further information please visit [email or web address] Please follow instruction sheet as given.-HOLD YOUR HCTZ THE MORNING OF THIS TESTING  Follow-Up: At [facility], you and your health needs are our priority.  As part of our continuing mission to provide you with exceptional heart care, we have created designated Provider Care Teams.  These Care Teams include your primary Cardiologist (physician) and Advanced Practice Providers (APPs -  Physician Assistants and Nurse Practitioners) who all work together to provide you with the care you need, when you need it.  Your next appointment:   6 month(s)  Provider:   Christell Constant, MD  or Marjie Skiff, PA-C        Other Instructions

## 2023-05-30 LAB — BASIC METABOLIC PANEL
BUN/Creatinine Ratio: 19 (ref 9–23)
BUN: 13 mg/dL (ref 6–24)
CO2: 26 mmol/L (ref 20–29)
Calcium: 9.5 mg/dL (ref 8.7–10.2)
Chloride: 101 mmol/L (ref 96–106)
Creatinine, Ser: 0.7 mg/dL (ref 0.57–1.00)
Glucose: 82 mg/dL (ref 70–99)
Potassium: 4.1 mmol/L (ref 3.5–5.2)
Sodium: 139 mmol/L (ref 134–144)
eGFR: 107 mL/min/{1.73_m2} (ref >59)

## 2023-05-30 LAB — LIPOPROTEIN A (LPA): Lipoprotein (a): 132 nmol/L — ABNORMAL HIGH (ref <75.0)

## 2023-06-21 ENCOUNTER — Encounter (HOSPITAL_COMMUNITY): Payer: Self-pay

## 2023-06-22 ENCOUNTER — Telehealth (HOSPITAL_COMMUNITY): Payer: Self-pay | Admitting: *Deleted

## 2023-06-22 NOTE — Telephone Encounter (Signed)
Attempted to call patient regarding upcoming cardiac CT appointment. Left message on voicemail with name and callback number Hayley Sharpe RN Navigator Cardiac Imaging [facility] [phone] Office   

## 2023-06-23 ENCOUNTER — Ambulatory Visit (HOSPITAL_COMMUNITY)
Admission: RE | Admit: 2023-06-23 | Discharge: 2023-06-23 | Disposition: A | Discharge status: Home / Self Care | Payer: BC Managed Care – PPO | Source: Ambulatory Visit | Attending: Student | Admitting: Student

## 2023-06-23 DIAGNOSIS — R072 Precordial pain: Secondary | ICD-10-CM | POA: Diagnosis present

## 2023-06-23 MED ORDER — METOPROLOL TARTRATE 5 MG/5ML IV SOLN
5.0000 mg | Freq: Once | INTRAVENOUS | Status: AC
  Administered 2023-06-23: 5 mg via INTRAVENOUS

## 2023-06-23 MED ORDER — IOHEXOL 350 MG/ML SOLN
100.0000 mL | Freq: Once | INTRAVENOUS | Status: AC | PRN
  Administered 2023-06-23: 100 mL via INTRAVENOUS

## 2023-06-23 MED ORDER — METOPROLOL TARTRATE 5 MG/5ML IV SOLN
INTRAVENOUS | Status: AC
  Filled 2023-06-23: qty 10

## 2023-06-23 MED ORDER — NITROGLYCERIN 0.4 MG SL SUBL
0.8000 mg | SUBLINGUAL_TABLET | Freq: Once | SUBLINGUAL | Status: AC
  Administered 2023-06-23: 0.8 mg via SUBLINGUAL

## 2023-06-23 MED ORDER — NITROGLYCERIN 0.4 MG SL SUBL
SUBLINGUAL_TABLET | SUBLINGUAL | Status: AC
  Filled 2023-06-23: qty 2

## 2023-06-27 ENCOUNTER — Telehealth: Payer: Self-pay

## 2023-06-27 NOTE — Telephone Encounter (Signed)
Lmom to discuss CT results. Waiting on a return call.

## 2023-07-04 NOTE — Telephone Encounter (Signed)
CT results reviewed by pt via mychart.

## 2023-07-10 ENCOUNTER — Telehealth: Payer: Self-pay | Admitting: Internal Medicine

## 2023-07-10 NOTE — Telephone Encounter (Signed)
Abnormal CT results

## 2023-07-10 NOTE — Telephone Encounter (Signed)
[facility] radiology calling with over read of recent CT Scan.    IMPRESSION: Partially calcified soft tissue nodule within the lower outer quadrant of the right breast. Correlation with mammography is recommended.  Marjie Skiff, PA has commented on this.  See result note on study

## 2023-07-11 NOTE — Telephone Encounter (Signed)
Attempted to call patient to go over non-cardiac read of recent coronary CTA which showed a partially calcified soft tissue nodule in her right breast but she did not answer. Left a message asking her to call back.  She needs a mammogram. Would recommended she follow-up with PCP for this.   Marcelino Duster, can you try calling her about this later as well?  Thank you!  Corrin Parker, PA-C 07/11/2023 8:24 AM

## 2023-07-11 NOTE — Telephone Encounter (Signed)
FORWARDED CT RESULT TO PCP. Pt informed of providers result & recommendations. Pt verbalized understanding. NO FURTHER questions.She will call PCP to discuss nodule.

## 2023-09-11 ENCOUNTER — Ambulatory Visit: Payer: Self-pay | Admitting: Surgery

## 2023-09-11 DIAGNOSIS — N6313 Unspecified lump in the right breast, lower outer quadrant: Secondary | ICD-10-CM

## 2023-09-22 NOTE — Progress Notes (Signed)
Surgical Instructions   Your procedure is scheduled on September 28, 2023. Report to [facility] Main Entrance "A" at 12:00 NOON., then check in with the Admitting office. Any questions or running late day of surgery: call [phone]  Questions prior to your surgery date: call [phone], Monday-Friday, 8am-4pm. If you experience any cold or flu symptoms such as cough, fever, chills, shortness of breath, etc. between now and your scheduled surgery, please notify us at the above number.     Remember:  Do not eat after midnight the night before your surgery   You may drink clear liquids until 11:00 the morning of your surgery.   Clear liquids allowed are: Water, Non-Citrus Juices (without pulp), Carbonated Beverages, Clear Tea (no milk, honey, etc.), Black Coffee Only (NO MILK, CREAM OR POWDERED CREAMER of any kind), and Gatorade.    Take these medicines the morning of surgery with A SIP OF WATER  JUNEL FE 1/20 1-20 MG-MCG   IF NEEDED EPINEPHrine  diphenhydrAMINE (BENADRYL)    May take these medicines IF NEEDED:    One week prior to surgery, STOP taking any Aspirin (unless otherwise instructed by your surgeon) Aleve, Naproxen, Ibuprofen, Motrin, Advil, Goody's, BC's, all herbal medications, fish oil, and non-prescription vitamins.                     Do NOT Smoke (Tobacco/Vaping) for 24 hours prior to your procedure.  If you use a CPAP at night, you may bring your mask/headgear for your overnight stay.   You will be asked to remove any contacts, glasses, piercing's, hearing aid's, dentures/partials prior to surgery. Please bring cases for these items if needed.    Patients discharged the day of surgery will not be allowed to drive home, and someone needs to stay with them for 24 hours.  SURGICAL WAITING ROOM VISITATION Patients may have no more than 2 support people in the waiting area - these visitors may rotate.   Pre-op nurse will coordinate an appropriate time for 1  ADULT support person, who may not rotate, to accompany patient in pre-op.  Children under the age of 33 must have an adult with them who is not the patient and must remain in the main waiting area with an adult.  If the patient needs to stay at the hospital during part of their recovery, the visitor guidelines for inpatient rooms apply.  Please refer to the [facility] website for the visitor guidelines for any additional information.   If you received a COVID test during your pre-op visit  it is requested that you wear a mask when out in public, stay away from anyone that may not be feeling well and notify your surgeon if you develop symptoms. If you have been in contact with anyone that has tested positive in the last 10 days please notify you surgeon.      Pre-operative CHG Bathing Instructions   You can play a key role in reducing the risk of infection after surgery. Your skin needs to be as free of germs as possible. You can reduce the number of germs on your skin by washing with CHG (chlorhexidine gluconate) soap before surgery. CHG is an antiseptic soap that kills germs and continues to kill germs even after washing.   DO NOT use if you have an allergy to chlorhexidine/CHG or antibacterial soaps. If your skin becomes reddened or irritated, stop using the CHG and notify one of our RNs at [phone].  TAKE A SHOWER THE NIGHT BEFORE SURGERY AND THE DAY OF SURGERY    Please keep in mind the following:  DO NOT shave, including legs and underarms, 48 hours prior to surgery.   You may shave your face before/day of surgery.  Place clean sheets on your bed the night before surgery Use a clean washcloth (not used since being washed) for each shower. DO NOT sleep with pet's night before surgery.  CHG Shower Instructions:  Wash your face and private area with normal soap. If you choose to wash your hair, wash first with your normal shampoo.  After you use shampoo/soap, rinse  your hair and body thoroughly to remove shampoo/soap residue.  Turn the water OFF and apply half the bottle of CHG soap to a CLEAN washcloth.  Apply CHG soap ONLY FROM YOUR NECK DOWN TO YOUR TOES (washing for 3-5 minutes)  DO NOT use CHG soap on face, private areas, open wounds, or sores.  Pay special attention to the area where your surgery is being performed.  If you are having back surgery, having someone wash your back for you may be helpful. Wait 2 minutes after CHG soap is applied, then you may rinse off the CHG soap.  Pat dry with a clean towel  Put on clean pajamas    Additional instructions for the day of surgery: DO NOT APPLY any lotions, deodorants, cologne, or perfumes.   Do not wear jewelry or makeup Do not wear nail polish, gel polish, artificial nails, or any other type of covering on natural nails (fingers and toes) Do not bring valuables to the hospital. [facility] is not responsible for valuables/personal belongings. Put on clean/comfortable clothes.  Please brush your teeth.  Ask your nurse before applying any prescription medications to the skin.

## 2023-09-25 ENCOUNTER — Encounter (HOSPITAL_COMMUNITY): Payer: Self-pay

## 2023-09-25 ENCOUNTER — Other Ambulatory Visit: Payer: Self-pay

## 2023-09-25 ENCOUNTER — Encounter (HOSPITAL_COMMUNITY)
Admission: RE | Admit: 2023-09-25 | Discharge: 2023-09-25 | Disposition: A | Discharge status: Home / Self Care | Payer: BC Managed Care – PPO | Source: Ambulatory Visit | Attending: Surgery | Admitting: Surgery

## 2023-09-25 VITALS — BP 117/60 | HR 56 | Temp 98.7°F | Resp 18 | Ht 62.5 in | Wt 208.9 lb

## 2023-09-25 DIAGNOSIS — Z01812 Encounter for preprocedural laboratory examination: Secondary | ICD-10-CM | POA: Insufficient documentation

## 2023-09-25 DIAGNOSIS — E669 Obesity, unspecified: Secondary | ICD-10-CM | POA: Insufficient documentation

## 2023-09-25 DIAGNOSIS — N6310 Unspecified lump in the right breast, unspecified quadrant: Secondary | ICD-10-CM | POA: Insufficient documentation

## 2023-09-25 DIAGNOSIS — I10 Essential (primary) hypertension: Secondary | ICD-10-CM | POA: Diagnosis not present

## 2023-09-25 DIAGNOSIS — D649 Anemia, unspecified: Secondary | ICD-10-CM | POA: Insufficient documentation

## 2023-09-25 DIAGNOSIS — Z79899 Other long term (current) drug therapy: Secondary | ICD-10-CM | POA: Diagnosis not present

## 2023-09-25 DIAGNOSIS — R7303 Prediabetes: Secondary | ICD-10-CM | POA: Insufficient documentation

## 2023-09-25 DIAGNOSIS — K219 Gastro-esophageal reflux disease without esophagitis: Secondary | ICD-10-CM | POA: Insufficient documentation

## 2023-09-25 DIAGNOSIS — Q2112 Patent foramen ovale: Secondary | ICD-10-CM | POA: Insufficient documentation

## 2023-09-25 DIAGNOSIS — Z6837 Body mass index (BMI) 37.0-37.9, adult: Secondary | ICD-10-CM | POA: Insufficient documentation

## 2023-09-25 DIAGNOSIS — Z87891 Personal history of nicotine dependence: Secondary | ICD-10-CM | POA: Insufficient documentation

## 2023-09-25 DIAGNOSIS — Z01818 Encounter for other preprocedural examination: Secondary | ICD-10-CM

## 2023-09-25 DIAGNOSIS — D241 Benign neoplasm of right breast: Secondary | ICD-10-CM | POA: Diagnosis present

## 2023-09-25 HISTORY — DX: Patent foramen ovale: Q21.12

## 2023-09-25 HISTORY — DX: Gastro-esophageal reflux disease without esophagitis: K21.9

## 2023-09-25 LAB — CBC
HCT: 39.6 % (ref 36.0–46.0)
Hemoglobin: 12.1 g/dL (ref 12.0–15.0)
MCH: 21.8 pg — ABNORMAL LOW (ref 26.0–34.0)
MCHC: 30.6 g/dL (ref 30.0–36.0)
MCV: 71.5 fL — ABNORMAL LOW (ref 80.0–100.0)
Platelets: 294 10*3/uL (ref 150–400)
RBC: 5.54 MIL/uL — ABNORMAL HIGH (ref 3.87–5.11)
RDW: 14.5 % (ref 11.5–15.5)
WBC: 7.5 10*3/uL (ref 4.0–10.5)
nRBC: 0 % (ref 0.0–0.2)

## 2023-09-25 LAB — BASIC METABOLIC PANEL
Anion gap: 8 (ref 5–15)
BUN: 12 mg/dL (ref 6–20)
CO2: 24 mmol/L (ref 22–32)
Calcium: 8.9 mg/dL (ref 8.9–10.3)
Chloride: 105 mmol/L (ref 98–111)
Creatinine, Ser: 0.73 mg/dL (ref 0.44–1.00)
GFR, Estimated: >60 mL/min (ref >60)
Glucose, Bld: 99 mg/dL (ref 70–99)
Potassium: 3.7 mmol/L (ref 3.5–5.1)
Sodium: 137 mmol/L (ref 135–145)

## 2023-09-25 LAB — POCT PREGNANCY, URINE: Preg Test, Ur: NEGATIVE

## 2023-09-25 NOTE — Progress Notes (Addendum)
PCP - [facility] Cardiologist - Mahesh Chandresekhar,MD  PPM/ICD - denies Device Orders -  Rep Notified -   Chest x-ray - 04/27/23(1v) EKG - 04/27/23 Stress Test - denies ECHO - 04/28/23 Cardiac Cath - denies  Sleep Study - denies CPAP -   Fasting Blood Sugar - na  Checks Blood Sugar _____ times a day  Last dose of GLP1 agonist-   GLP1 instructions:   Blood Thinner Instructions:na Aspirin Instructions:na  ERAS Protcol -clears until 1100 PRE-SURGERY Ensure or G2- no  COVID TEST- na   Anesthesia review: yes- radioactive seed patient  Patient denies shortness of breath, fever, cough and chest pain at PAT appointment   All instructions explained to the patient, with a verbal understanding of the material. Patient agrees to go over the instructions while at home for a better understanding. Patient also instructed to wear a mask when out in public prior to surgery. The opportunity to ask questions was provided.

## 2023-09-26 ENCOUNTER — Encounter (HOSPITAL_COMMUNITY): Payer: Self-pay

## 2023-09-26 NOTE — Progress Notes (Signed)
Anesthesia Chart Review:  Case: [id number] Date/Time: 09/28/23 1345   Procedure: RIGHT BREAST SEED LUMPECTOMY (Right)   Anesthesia type: General   Pre-op diagnosis: RIGHT BREAST MASS   Location: [facility] OR ROOM 09 / [facility] OR   Surgeons: Harriette Bouillon, MD       DISCUSSION: Patient is a 47 year old female scheduled for the above procedure. She was referred for mammogram after 06/23/23 CCTA had an incidental finding of a partially calcified soft tissue right breast nodule. Per Dr. 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 Since it had increased in size, she desired excision.   History includes former smoker, GERD, prediabetes, anemia.  Normal coronaries, small PFO noted on 06/23/23 CCTA. BMI is consistent with obesity.  She had recent cardiology evaluation by Dr. Izora Ribas in July 2024 during admission for atypical chest pain. HS Troponin negative. Symptoms resolved with GI cocktail. ED provider noted possible short run of NSVT, but cardiology felt it was likely artifact. Echo on 04/28/23 showed LVEF 55-60%, no regional wall motion abnormalities, normal diastolic parameters, normal RV systolic function, trivial MR. She had out-patient coronary CT on 06/23/23 that showed CAC of 0, normal coronaries, small PFO. 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    RSL is scheduled for 09/27/23 at 2:15 PM. Urine pregnancy test negative on 09/25/23.    VS: BP 117/60   Pulse (!) 56   Temp 37.1 C   Resp 18   Ht 5' 2.5" (1.588 m)   Wt 94.8 kg   LMP 09/16/2023 (Exact Date)   SpO2 98%   BMI 37.60 kg/m    PROVIDERS: Medicine, [facility] is PCP  Sheran Spine, MD is cardiologist   LABS: Labs reviewed: Acceptable for surgery. (all labs ordered are listed, but only abnormal results are displayed)  Labs Reviewed  CBC - Abnormal; Notable for the following components:      Result Value   RBC 5.54 (*)    MCV 71.5 (*)    MCH  21.8 (*)    All other components within normal limits  BASIC METABOLIC PANEL  POCT PREGNANCY, URINE    IMAGES: CT Chest (over read CCTA) 06/23/23: IMPRESSION: Partially calcified soft tissue nodule within the lower outer quadrant of the right breast. Correlation with mammography is recommended.   CT Head 08/30/23 ([facility]): IMPRESSION: - No evidence of acute intracranial abnormality.  - Partially empty and expanded sella, which is often a normal anatomic  variant but can be associated with idiopathic intracranial  hypertension.    EKG: 04/28/23: Sinus bradycardia at 53 bpm Anterior infarct , age undetermined Abnormal ECG When compared with ECG of 27-Apr-2023 16:56, PREVIOUS ECG IS PRESENT No significant change since last tracing Confirmed by Rinaldo Cloud [phone]) on 05/02/2023 4:23:03 PM   CV: CT Coronary 06/23/23: IMPRESSION: 1. Coronary calcium score of 0. 2. Normal coronary origin with right dominance. 3. Normal coronary arteries. 4. Small PFO. RECOMMENDATIONS: 1. No evidence of CAD (0%). Consider non-atherosclerotic causes of chest pain.    Echo 04/28/23: IMPRESSIONS   1. Left ventricular ejection fraction, by estimation, is 55 to 60%. The  left ventricle has normal function. The left ventricle has no regional  wall motion abnormalities. Left ventricular diastolic parameters were  normal.   2. Right ventricular systolic function is normal. The right ventricular  size is normal.   3. The mitral valve is normal in structure. Trivial mitral valve  regurgitation.  4. The aortic valve is normal in structure. Aortic valve regurgitation is  not visualized.    Past Medical History:  Diagnosis Date   Allergic urticaria    Allergy    bug bite; hives   Anemia    DDD (degenerative disc disease), cervical    Eczema    GERD (gastroesophageal reflux disease)    Hemorrhoid    Obesity    PFO (patent foramen ovale)    small PFO 06/23/23 CCTA   Prediabetes     Spondylolysis of cervical region    Vitamin D deficiency     Past Surgical History:  Procedure Laterality Date   uterine biopsy     benign    MEDICATIONS:  Cholecalciferol (VITAMIN D3) 125 MCG (5000 UT) CAPS   diphenhydrAMINE (BENADRYL) 25 MG tablet   EPINEPHrine 0.3 mg/0.3 mL IJ SOAJ injection   ferrous sulfate 325 (65 FE) MG EC tablet   hydrochlorothiazide (HYDRODIURIL) 12.5 MG tablet   ibuprofen (ADVIL) 200 MG tablet   JUNEL FE 1/20 1-20 MG-MCG tablet   Multiple Vitamin (MULTIVITAMIN) capsule   No current facility-administered medications for this encounter.    Shonna Chock, PA-C Surgical Short Stay/Anesthesiology [facility] Phone [phone] [facility] Phone [phone] 09/26/2023 3:54 PM

## 2023-09-26 NOTE — Anesthesia Preprocedure Evaluation (Signed)
Anesthesia Evaluation  Patient identified by MRN, date of birth, ID band Patient awake    Reviewed: Allergy & Precautions, NPO status , Patient's Chart, lab work & pertinent test results  Airway Mallampati: II  TM Distance: >3 FB Neck ROM: Full    Dental  (+) Teeth Intact, Dental Advisory Given   Pulmonary former smoker   Pulmonary exam normal breath sounds clear to auscultation       Cardiovascular hypertension (129/72 preop), Pt. on medications Normal cardiovascular exam Rhythm:Regular Rate:Normal     Neuro/Psych negative neurological ROS  negative psych ROS   GI/Hepatic Neg liver ROS,GERD  Controlled,,  Endo/Other  diabetes (prediabetic)  Obesity BMI 38  Renal/GU negative Renal ROS  negative genitourinary   Musculoskeletal  (+) Arthritis , Osteoarthritis,    Abdominal  (+) + obese  Peds  Hematology negative hematology ROS (+) Hb 12   Anesthesia Other Findings   Reproductive/Obstetrics negative OB ROS                             Anesthesia Physical Anesthesia Plan  ASA: 2  Anesthesia Plan: General   Post-op Pain Management: Tylenol PO (pre-op)* and Toradol IV (intra-op)*   Induction: Intravenous  PONV Risk Score and Plan: 3 and Ondansetron, Dexamethasone, Midazolam and Treatment may vary due to age or medical condition  Airway Management Planned: LMA  Additional Equipment: None  Intra-op Plan:   Post-operative Plan: Extubation in OR  Informed Consent: I have reviewed the patients History and Physical, chart, labs and discussed the procedure including the risks, benefits and alternatives for the proposed anesthesia with the patient or authorized representative who has indicated his/her understanding and acceptance.     Dental advisory given  Plan Discussed with: CRNA  Anesthesia Plan Comments: ( )       Anesthesia Quick Evaluation

## 2023-09-27 NOTE — H&P (Signed)
History of Present Illness: Renee Avery is a 47 y.o. female who is seen today as an office consultation for evaluation of New Consultation (RT BREAST)  Patient presents for a right breast mass. This was present back in 2019 after ultrasound core biopsy showed a fibroadenoma. It is changed in size on recent surveillance ultrasounds and she desires excision due to increasing size. It does not cause any significant pain but has gotten larger.  Review of Systems: A complete review of systems was obtained from the patient. I have reviewed this information and discussed as appropriate with the patient. See HPI as well for other ROS.    Medical History: History reviewed. No pertinent past medical history.  There is no problem list on file for this patient.  History reviewed. No pertinent surgical history.   No Known Allergies  Current Outpatient Medications on File Prior to Visit  Medication Sig Dispense Refill  cyclobenzaprine (FLEXERIL) 10 MG tablet TAKE ONE TABLET BY MOUTH AT BEDTIME AS NEEDED FOR MUSCLE SPASMS.  ferrous sulfate 325 (65 FE) MG EC tablet Take 325 mg by mouth once daily  hydroCHLOROthiazide (HYDRODIURIL) 12.5 MG tablet Take 12.5 mg by mouth once daily  ibuprofen (MOTRIN) 800 MG tablet Take 800 mg by mouth every 8 (eight) hours as needed  multivitamin with iron-minerals (THERA-M) 9 mg iron-400 mcg tablet Take 1 tablet by mouth once daily  naproxen (NAPROSYN) 500 MG tablet Take 500 mg by mouth  norethindrone-ethinyl estradiol (JUNEL FE 1/20) 1 mg-20 mcg (21)/75 mg (7) tablet Take 1 tablet by mouth once daily   No current facility-administered medications on file prior to visit.   Family History  Problem Relation Age of Onset  Colon cancer Mother  Coronary Artery Disease (Blocked arteries around heart) Father  Diabetes Maternal Aunt    Social History   Tobacco Use  Smoking Status Former  Types: Cigarettes  Smokeless Tobacco Never    Social History    Socioeconomic History  Marital status: Single  Tobacco Use  Smoking status: Former  Types: Cigarettes  Smokeless tobacco: Never  Vaping Use  Vaping status: Never Used  Substance and Sexual Activity  Alcohol use: Yes  Drug use: Never   Social Drivers of Corporate investment banker Strain: Low Risk (11/30/2022)  Received from [facility]  Overall Financial Resource Strain (CARDIA)  Difficulty of Paying Living Expenses: Not very hard  Food Insecurity: Food Insecurity Present (04/27/2023)  Received from [facility]  Hunger Vital Sign  Worried About Running Out of Food in the Last Year: Sometimes true  Ran Out of Food in the Last Year: Never true  Transportation Needs: No Transportation Needs (04/27/2023)  Received from [facility] - Transportation  Lack of Transportation (Medical): No  Lack of Transportation (Non-Medical): No  Physical Activity: Insufficiently Active (11/30/2022)  Received from [facility]  Exercise Vital Sign  Days of Exercise per Week: 4 days  Minutes of Exercise per Session: 30 min  Stress: Stress Concern Present (11/30/2022)  Received from [facility] - Occupational Stress Questionnaire  Feeling of Stress : To some extent  Social Connections: Socially Integrated (11/30/2022)  Received from [facility]  Social Network  How would you rate your social network (family, work, friends)?: Good participation with social networks  Housing Stability: High Risk (11/30/2022)  Received from [facility] Stability Vital Sign  Unable to Pay for Housing in the Last Year: Yes  Number of Places Lived  in the Last Year: 1  Unstable Housing in the Last Year: No   Objective:   Vitals:  09/11/23 1356  BP: 112/76  Pulse: 50  Temp: 36.8 C (98.2 F)  SpO2: 98%  Weight: 95 kg (209 lb 6.4 oz)  Height: 158.8 cm (5' 2.5")  PainSc: 0-No pain   Body mass index is 37.69 kg/m.  Physical Exam Exam  conducted with a chaperone present.  Pulmonary:  Effort: Pulmonary effort is normal.  Chest:  Breasts: Right: Mass present.  Left: No mass.   Comments: 4 cm rubbery mobile mass right breast Musculoskeletal:  General: Normal range of motion.  Cervical back: Normal range of motion.  Lymphadenopathy:  Upper Body:  Right upper body: No axillary adenopathy.  Left upper body: No axillary adenopathy.  Neurological:  General: No focal deficit present.  Mental Status: She is alert.  Psychiatric:  Mood and Affect: Mood normal.     Labs, Imaging and Diagnostic Testing:  Diagnosis Breast, right, needle core biopsy - FIBROADENOMA - NO MALIGNANCY IDENTIFIED Microscopic Comment These results were called to [facility] on March 15, 2018. Assessment and Plan:   Diagnoses and all orders for this visit:  Mass of upper outer quadrant of right breast   Patient desires lumpectomy to remove the mass in her right breast. 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 Risk of bleeding, infection, cosmetic deformity, pain, anesthesia risk, and the use of a seed were reviewed today. Postop care reviewed. Observation also discussed today since these are uniformly benign lesion but given its change in size, excision recommended.  Hayden Rasmussen, MD

## 2023-09-28 ENCOUNTER — Ambulatory Visit (HOSPITAL_COMMUNITY)
Admission: RE | Admit: 2023-09-28 | Discharge: 2023-09-28 | Disposition: A | Discharge status: Home / Self Care | Payer: BC Managed Care – PPO | Attending: Surgery | Admitting: Surgery

## 2023-09-28 ENCOUNTER — Other Ambulatory Visit: Payer: Self-pay

## 2023-09-28 ENCOUNTER — Encounter (HOSPITAL_COMMUNITY)
Admission: RE | Disposition: A | Discharge status: Home / Self Care | Payer: Self-pay | Source: Home / Self Care | Attending: Surgery

## 2023-09-28 ENCOUNTER — Ambulatory Visit (HOSPITAL_COMMUNITY): Payer: Self-pay | Admitting: Vascular Surgery

## 2023-09-28 ENCOUNTER — Ambulatory Visit (HOSPITAL_COMMUNITY): Payer: BC Managed Care – PPO | Admitting: Anesthesiology

## 2023-09-28 ENCOUNTER — Encounter (HOSPITAL_COMMUNITY): Payer: Self-pay | Admitting: Surgery

## 2023-09-28 DIAGNOSIS — Z79899 Other long term (current) drug therapy: Secondary | ICD-10-CM | POA: Insufficient documentation

## 2023-09-28 DIAGNOSIS — D649 Anemia, unspecified: Secondary | ICD-10-CM | POA: Insufficient documentation

## 2023-09-28 DIAGNOSIS — K219 Gastro-esophageal reflux disease without esophagitis: Secondary | ICD-10-CM | POA: Insufficient documentation

## 2023-09-28 DIAGNOSIS — D241 Benign neoplasm of right breast: Secondary | ICD-10-CM | POA: Diagnosis not present

## 2023-09-28 DIAGNOSIS — Z87891 Personal history of nicotine dependence: Secondary | ICD-10-CM | POA: Insufficient documentation

## 2023-09-28 DIAGNOSIS — I10 Essential (primary) hypertension: Secondary | ICD-10-CM | POA: Insufficient documentation

## 2023-09-28 DIAGNOSIS — R7303 Prediabetes: Secondary | ICD-10-CM | POA: Insufficient documentation

## 2023-09-28 DIAGNOSIS — N6313 Unspecified lump in the right breast, lower outer quadrant: Secondary | ICD-10-CM

## 2023-09-28 HISTORY — PX: BREAST LUMPECTOMY WITH RADIOACTIVE SEED LOCALIZATION: SHX6424

## 2023-09-28 SURGERY — BREAST LUMPECTOMY WITH RADIOACTIVE SEED LOCALIZATION
Anesthesia: General | Site: Breast | Laterality: Right

## 2023-09-28 MED ORDER — CHLORHEXIDINE GLUCONATE CLOTH 2 % EX PADS
6.0000 | MEDICATED_PAD | Freq: Once | CUTANEOUS | Status: DC
Start: 2023-09-28 — End: 2023-09-28

## 2023-09-28 MED ORDER — ONDANSETRON HCL 4 MG/2ML IJ SOLN: 4.0000 mg | Freq: Once | INTRAMUSCULAR | Status: DC | PRN

## 2023-09-28 MED ORDER — SODIUM CHLORIDE 0.9 % IV SOLN
3.0000 g | INTRAVENOUS | Status: DC
  Filled 2023-09-28: qty 3

## 2023-09-28 MED ORDER — ONDANSETRON HCL 4 MG/2ML IJ SOLN
INTRAMUSCULAR | Status: AC
  Filled 2023-09-28: qty 2

## 2023-09-28 MED ORDER — ONDANSETRON HCL 4 MG/2ML IJ SOLN
INTRAMUSCULAR | Status: DC | PRN
  Administered 2023-09-28: 4 mg via INTRAVENOUS

## 2023-09-28 MED ORDER — ORAL CARE MOUTH RINSE: 15.0000 mL | Freq: Once | OROMUCOSAL | Status: AC

## 2023-09-28 MED ORDER — MEPERIDINE HCL 25 MG/ML IJ SOLN: 6.2500 mg | INTRAMUSCULAR | Status: DC | PRN

## 2023-09-28 MED ORDER — KETOROLAC TROMETHAMINE 30 MG/ML IJ SOLN: 30.0000 mg | Freq: Once | INTRAMUSCULAR | Status: DC | PRN

## 2023-09-28 MED ORDER — DEXAMETHASONE SODIUM PHOSPHATE 10 MG/ML IJ SOLN
INTRAMUSCULAR | Status: DC | PRN
  Administered 2023-09-28: 10 mg via INTRAVENOUS

## 2023-09-28 MED ORDER — EPHEDRINE 5 MG/ML INJ
INTRAVENOUS | Status: AC
  Filled 2023-09-28: qty 5

## 2023-09-28 MED ORDER — MIDAZOLAM HCL 2 MG/2ML IJ SOLN
INTRAMUSCULAR | Status: DC | PRN
  Administered 2023-09-28: 2 mg via INTRAVENOUS

## 2023-09-28 MED ORDER — LIDOCAINE 2% (20 MG/ML) 5 ML SYRINGE
INTRAMUSCULAR | Status: DC | PRN
  Administered 2023-09-28: 40 mg via INTRAVENOUS

## 2023-09-28 MED ORDER — HYDROMORPHONE HCL 1 MG/ML IJ SOLN
0.2500 mg | INTRAMUSCULAR | Status: DC | PRN
Start: 2023-09-28 — End: 2023-09-28
  Administered 2023-09-28 (×2): 0.5 mg via INTRAVENOUS

## 2023-09-28 MED ORDER — BUPIVACAINE-EPINEPHRINE (PF) 0.25% -1:200000 IJ SOLN
INTRAMUSCULAR | Status: AC
  Filled 2023-09-28: qty 30

## 2023-09-28 MED ORDER — PHENYLEPHRINE 80 MCG/ML (10ML) SYRINGE FOR IV PUSH (FOR BLOOD PRESSURE SUPPORT)
PREFILLED_SYRINGE | INTRAVENOUS | Status: AC
  Filled 2023-09-28: qty 10

## 2023-09-28 MED ORDER — CHLORHEXIDINE GLUCONATE CLOTH 2 % EX PADS: 6.0000 | MEDICATED_PAD | Freq: Once | CUTANEOUS | Status: DC

## 2023-09-28 MED ORDER — DEXAMETHASONE SODIUM PHOSPHATE 10 MG/ML IJ SOLN
INTRAMUSCULAR | Status: AC
  Filled 2023-09-28: qty 1

## 2023-09-28 MED ORDER — FENTANYL CITRATE (PF) 250 MCG/5ML IJ SOLN
INTRAMUSCULAR | Status: AC
  Filled 2023-09-28: qty 5

## 2023-09-28 MED ORDER — OXYCODONE HCL 5 MG PO TABS: 5.0000 mg | ORAL_TABLET | Freq: Four times a day (QID) | ORAL | 0 refills | Status: AC | PRN

## 2023-09-28 MED ORDER — AMISULPRIDE (ANTIEMETIC) 5 MG/2ML IV SOLN: 10.0000 mg | Freq: Once | INTRAVENOUS | Status: DC | PRN

## 2023-09-28 MED ORDER — PROPOFOL 10 MG/ML IV BOLUS
INTRAVENOUS | Status: AC
  Filled 2023-09-28: qty 20

## 2023-09-28 MED ORDER — FENTANYL CITRATE (PF) 250 MCG/5ML IJ SOLN
INTRAMUSCULAR | Status: DC | PRN
  Administered 2023-09-28 (×2): 50 ug via INTRAVENOUS

## 2023-09-28 MED ORDER — MIDAZOLAM HCL 2 MG/2ML IJ SOLN
INTRAMUSCULAR | Status: AC
  Filled 2023-09-28: qty 2

## 2023-09-28 MED ORDER — PROPOFOL 10 MG/ML IV BOLUS
INTRAVENOUS | Status: DC | PRN
  Administered 2023-09-28: 200 mg via INTRAVENOUS

## 2023-09-28 MED ORDER — CEFAZOLIN SODIUM 1 G IJ SOLR
INTRAMUSCULAR | Status: AC
  Filled 2023-09-28: qty 20

## 2023-09-28 MED ORDER — HYDROMORPHONE HCL 1 MG/ML IJ SOLN
INTRAMUSCULAR | Status: AC
  Filled 2023-09-28: qty 1

## 2023-09-28 MED ORDER — KETOROLAC TROMETHAMINE 30 MG/ML IJ SOLN
INTRAMUSCULAR | Status: AC
  Filled 2023-09-28: qty 1

## 2023-09-28 MED ORDER — KETOROLAC TROMETHAMINE 30 MG/ML IJ SOLN
INTRAMUSCULAR | Status: DC | PRN
  Administered 2023-09-28: 30 mg via INTRAVENOUS

## 2023-09-28 MED ORDER — ACETAMINOPHEN 500 MG PO TABS
1000.0000 mg | ORAL_TABLET | Freq: Once | ORAL | Status: AC
  Administered 2023-09-28: 1000 mg via ORAL
  Filled 2023-09-28: qty 2

## 2023-09-28 MED ORDER — OXYCODONE HCL 5 MG PO TABS: 5.0000 mg | ORAL_TABLET | Freq: Once | ORAL | Status: DC | PRN

## 2023-09-28 MED ORDER — SODIUM CHLORIDE (PF) 0.9 % IJ SOLN
INTRAMUSCULAR | Status: AC
  Filled 2023-09-28: qty 10

## 2023-09-28 MED ORDER — SODIUM CHLORIDE 0.9 % IV SOLN
INTRAVENOUS | Status: DC | PRN
Start: 2023-09-28 — End: 2023-09-28

## 2023-09-28 MED ORDER — CEFAZOLIN SODIUM-DEXTROSE 2-3 GM-%(50ML) IV SOLR
INTRAVENOUS | Status: DC | PRN
  Administered 2023-09-28: 2 g via INTRAVENOUS

## 2023-09-28 MED ORDER — BUPIVACAINE-EPINEPHRINE 0.25% -1:200000 IJ SOLN
INTRAMUSCULAR | Status: DC | PRN
  Administered 2023-09-28: 15 mL

## 2023-09-28 MED ORDER — CHLORHEXIDINE GLUCONATE 0.12 % MT SOLN
15.0000 mL | Freq: Once | OROMUCOSAL | Status: AC
  Administered 2023-09-28: 15 mL via OROMUCOSAL
  Filled 2023-09-28: qty 15

## 2023-09-28 MED ORDER — OXYCODONE HCL 5 MG/5ML PO SOLN: 5.0000 mg | Freq: Once | ORAL | Status: DC | PRN

## 2023-09-28 MED ORDER — 0.9 % SODIUM CHLORIDE (POUR BTL) OPTIME
TOPICAL | Status: DC | PRN
  Administered 2023-09-28: 1000 mL

## 2023-09-28 MED ORDER — LIDOCAINE 2% (20 MG/ML) 5 ML SYRINGE
INTRAMUSCULAR | Status: AC
  Filled 2023-09-28: qty 5

## 2023-09-28 SURGICAL SUPPLY — 29 items
BAG COUNTER SPONGE SURGICOUNT (BAG) ×1 IMPLANT
BINDER BREAST 3XL (GAUZE/BANDAGES/DRESSINGS) IMPLANT
CANISTER SUCT 3000ML PPV (MISCELLANEOUS) IMPLANT
CHLORAPREP W/TINT 26 (MISCELLANEOUS) ×1 IMPLANT
COVER PROBE W GEL 5X96 (DRAPES) ×1 IMPLANT
COVER SURGICAL LIGHT HANDLE (MISCELLANEOUS) ×1 IMPLANT
DERMABOND ADVANCED .7 DNX12 (GAUZE/BANDAGES/DRESSINGS) ×1 IMPLANT
DEVICE DUBIN SPECIMEN MAMMOGRA (MISCELLANEOUS) ×1 IMPLANT
DRAPE CHEST BREAST 15X10 FENES (DRAPES) ×1 IMPLANT
ELECT CAUTERY BLADE 6.4 (BLADE) ×1 IMPLANT
ELECT REM PT RETURN 9FT ADLT (ELECTROSURGICAL) ×1
ELECTRODE REM PT RTRN 9FT ADLT (ELECTROSURGICAL) ×1 IMPLANT
GLOVE BIO SURGEON STRL SZ8 (GLOVE) ×1 IMPLANT
GLOVE BIOGEL PI IND STRL 8 (GLOVE) ×1 IMPLANT
GOWN STRL REUS W/ TWL LRG LVL3 (GOWN DISPOSABLE) ×1 IMPLANT
GOWN STRL REUS W/ TWL XL LVL3 (GOWN DISPOSABLE) ×1 IMPLANT
KIT BASIN OR (CUSTOM PROCEDURE TRAY) ×1 IMPLANT
KIT MARKER MARGIN INK (KITS) ×1 IMPLANT
LIGHT WAVEGUIDE WIDE FLAT (MISCELLANEOUS) IMPLANT
NDL HYPO 25GX1X1/2 BEV (NEEDLE) ×1 IMPLANT
NEEDLE HYPO 25GX1X1/2 BEV (NEEDLE) ×1
NS IRRIG 1000ML POUR BTL (IV SOLUTION) IMPLANT
PACK GENERAL/GYN (CUSTOM PROCEDURE TRAY) ×1 IMPLANT
SUT MNCRL AB 4-0 PS2 18 (SUTURE) ×1 IMPLANT
SUT SILK 2 0 SH (SUTURE) IMPLANT
SUT VIC AB 2-0 SH 27XBRD (SUTURE) IMPLANT
SUT VIC AB 3-0 SH 8-18 (SUTURE) ×1 IMPLANT
SYR CONTROL 10ML LL (SYRINGE) ×1 IMPLANT
TOWEL GREEN STERILE FF (TOWEL DISPOSABLE) IMPLANT

## 2023-09-28 NOTE — Op Note (Signed)
Preoperative diagnosis: Right breast mass consistent with fibroadenoma that is increasing in size lower outer quadrant  Postoperative diagnosis: Same  Procedure: Right breast seed localized lumpectomy  Surgeon: Harriette Bouillon, MD  Anesthesia: LMA with 0.25% Marcaine with epinephrine  EBL: Minimal  Specimen: Right breast mass with 2 biopsy clips and a single seed verified by Faxitron  Indications for procedure: The patient is a 47 year old female with a painful right breast mass.  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  It is changed in size causing more discomfort and the patient desires excision.The procedure has been discussed with the patient. Alternatives to surgery have been discussed with the patient.  Risks of surgery include bleeding,  Infection,  Seroma formation, death,  and the need for further surgery.   The patient understands and wishes to proceed.     Description of procedure: The patient was met in the holding area questions were answered.  Of note the seed was placed as an outpatient by radiology.  The right breast was marked as the correct site.  Films were available for review.  She was taken back to the operative room.  She was placed supine upon the operating table.  After induction of general anesthesia, right breast was prepped and draped in sterile fashion and timeout performed.  Neoprobe used identify the seeds right lower outer quadrant of the breast.  A curved linear incision was made over the signal.  Dissection was carried down all tissue and the seed and clip were excised with grossly negative margins.  Imaging revealed the seed with both biopsy clips to be in the mass.  Images were taken.  The cavity is found to be hemostatic.  Local anesthetic infiltrated throughout the deep tissue planes were approximated with 3-0 Vicryl.  4 Monocryl was used to close the skin in a subcuticular fashion.  Dermabond applied.  Breast binder placed.  All  final counts were correct.  The patient was awoke extubated taken to recovery in satisfactory condition.

## 2023-09-28 NOTE — Discharge Instructions (Signed)
[facility] [facility] Office Phone Number [phone]  BREAST BIOPSY/ PARTIAL MASTECTOMY: POST OP INSTRUCTIONS  Always review your discharge instruction sheet given to you by the facility where your surgery was performed.  IF YOU HAVE DISABILITY OR FAMILY LEAVE FORMS, YOU MUST BRING THEM TO THE OFFICE FOR PROCESSING.  DO NOT GIVE THEM TO YOUR DOCTOR.  A prescription for pain medication may be given to you upon discharge.  Take your pain medication as prescribed, if needed.  If narcotic pain medicine is not needed, then you may take acetaminophen (Tylenol) or ibuprofen (Advil) as needed. Take your usually prescribed medications unless otherwise directed If you need a refill on your pain medication, please contact your pharmacy.  They will contact our office to request authorization.  Prescriptions will not be filled after 5pm or on week-ends. You should eat very light the first 24 hours after surgery, such as soup, crackers, pudding, etc.  Resume your normal diet the day after surgery. Most patients will experience some swelling and bruising in the breast.  Ice packs and a good support bra will help.  Swelling and bruising can take several days to resolve.  It is common to experience some constipation if taking pain medication after surgery.  Increasing fluid intake and taking a stool softener will usually help or prevent this problem from occurring.  A mild laxative (Milk of Magnesia or Miralax) should be taken according to package directions if there are no bowel movements after 48 hours. Unless discharge instructions indicate otherwise, you may remove your bandages 24-48 hours after surgery, and you may shower at that time.  You may have steri-strips (small skin tapes) in place directly over the incision.  These strips should be left on the skin for 7-10 days.  If your surgeon used skin glue on the incision, you may shower in 24 hours.  The glue will flake off over the next 2-3 weeks.  Any  sutures or staples will be removed at the office during your follow-up visit. ACTIVITIES:  You may resume regular daily activities (gradually increasing) beginning the next day.  Wearing a good support bra or sports bra minimizes pain and swelling.  You may have sexual intercourse when it is comfortable. You may drive when you no longer are taking prescription pain medication, you can comfortably wear a seatbelt, and you can safely maneuver your car and apply brakes. RETURN TO WORK:  __________2 weeks   no restrictions ____________________________________________________________________________ Renee Avery should see your doctor in the office for a follow-up appointment approximately two weeks after your surgery.  Your doctor's nurse will typically make your follow-up appointment when she calls you with your pathology report.  Expect your pathology report 2-3 business days after your surgery.  You may call to check if you do not hear from [place] after three days. OTHER INSTRUCTIONS: _______________________________________________________________________________________________ _____________________________________________________________________________________________________________________________________ _____________________________________________________________________________________________________________________________________ _____________________________________________________________________________________________________________________________________  WHEN TO CALL YOUR DOCTOR: Fever over 101.0 Nausea and/or vomiting. Extreme swelling or bruising. Continued bleeding from incision. Increased pain, redness, or drainage from the incision.  The clinic staff is available to answer your questions during regular business hours.  Please don't hesitate to call and ask to speak to one of the nurses for clinical concerns.  If you have a medical emergency, go to the nearest emergency room or call 911.  A  surgeon from [facility] is always on call at the hospital.  For further questions, please visit [email or web address]

## 2023-09-28 NOTE — Anesthesia Postprocedure Evaluation (Addendum)
Anesthesia Post Note  Patient: Renee Avery  Procedure(s) Performed: RIGHT BREAST SEED LUMPECTOMY (Right: Breast)     Patient location during evaluation: PACU Anesthesia Type: General Level of consciousness: awake and alert Pain management: pain level controlled Vital Signs Assessment: post-procedure vital signs reviewed and stable Respiratory status: spontaneous breathing, nonlabored ventilation, respiratory function stable and patient connected to nasal cannula oxygen Cardiovascular status: blood pressure returned to baseline and stable Postop Assessment: no apparent nausea or vomiting Anesthetic complications: no  No notable events documented.  Last Vitals:  Vitals:   09/28/23 1645 09/28/23 1700  BP: 110/70 111/68  Pulse:  68  Resp:  17  Temp:  36.6 C  SpO2:  99%                 Shelton Silvas

## 2023-09-28 NOTE — Anesthesia Procedure Notes (Signed)
Procedure Name: LMA Insertion Date/Time: 09/28/2023 3:16 PM  Performed by: Alwyn Ren, CRNAPre-anesthesia Checklist: Patient identified, Emergency Drugs available, Suction available, Patient being monitored and Timeout performed Patient Re-evaluated:Patient Re-evaluated prior to induction Oxygen Delivery Method: Circle system utilized Preoxygenation: Pre-oxygenation with 100% oxygen Induction Type: IV induction LMA: LMA inserted LMA Size: 4.0 Number of attempts: 1

## 2023-09-28 NOTE — Interval H&P Note (Signed)
History and Physical Interval Note:  09/28/2023 2:48 PM  Renee Avery  has presented today for surgery, with the diagnosis of RIGHT BREAST MASS.  The various methods of treatment have been discussed with the patient and family. After consideration of risks, benefits and other options for treatment, the patient has consented to  Procedure(s): RIGHT BREAST SEED LUMPECTOMY (Right) as a surgical intervention.  The patient's history has been reviewed, patient examined, no change in status, stable for surgery.  I have reviewed the patient's chart and labs.  Questions were answered to the patient's satisfaction.     Rudolfo Brandow A Burlene Montecalvo

## 2023-09-28 NOTE — Transfer of Care (Signed)
Immediate Anesthesia Transfer of Care Note  Patient: Renee Avery  Procedure(s) Performed: RIGHT BREAST SEED LUMPECTOMY (Right: Breast)  Patient Location: PACU  Anesthesia Type:General  Level of Consciousness: awake, alert , and oriented  Airway & Oxygen Therapy: Patient Spontanous Breathing and Patient connected to face mask oxygen  Post-op Assessment: Report given to RN and Post -op Vital signs reviewed and stable  Post vital signs: Reviewed and stable  Last Vitals:  Vitals Value Taken Time  BP 121/64 09/28/23 1601  Temp    Pulse 76 09/28/23 1602  Resp 17 09/28/23 1602  SpO2 99 % 09/28/23 1602  Vitals shown include unfiled device data.  Last Pain:  Vitals:   09/28/23 1255  TempSrc:   PainSc: 0-No pain         Complications: No notable events documented.

## 2023-09-29 ENCOUNTER — Encounter (HOSPITAL_COMMUNITY): Payer: Self-pay | Admitting: Surgery

## 2023-10-03 LAB — SURGICAL PATHOLOGY

## 2023-10-12 ENCOUNTER — Encounter: Payer: Self-pay | Admitting: Surgery

## 2024-02-26 ENCOUNTER — Emergency Department (HOSPITAL_BASED_OUTPATIENT_CLINIC_OR_DEPARTMENT_OTHER): Admission: EM | Admit: 2024-02-26 | Discharge: 2024-02-26 | Discharge status: Against Medical Advice | Payer: Self-pay

## 2024-02-26 ENCOUNTER — Encounter (HOSPITAL_BASED_OUTPATIENT_CLINIC_OR_DEPARTMENT_OTHER): Payer: Self-pay | Admitting: Emergency Medicine

## 2024-02-26 ENCOUNTER — Other Ambulatory Visit: Payer: Self-pay

## 2024-02-26 DIAGNOSIS — R1084 Generalized abdominal pain: Secondary | ICD-10-CM | POA: Insufficient documentation

## 2024-02-26 DIAGNOSIS — Z5321 Procedure and treatment not carried out due to patient leaving prior to being seen by health care provider: Secondary | ICD-10-CM | POA: Insufficient documentation

## 2024-02-26 DIAGNOSIS — R110 Nausea: Secondary | ICD-10-CM | POA: Insufficient documentation

## 2024-02-26 LAB — PREGNANCY, URINE: Preg Test, Ur: NEGATIVE

## 2024-02-26 LAB — COMPREHENSIVE METABOLIC PANEL WITH GFR
ALT: 5 U/L (ref 0–44)
AST: 16 U/L (ref 15–41)
Albumin: 3.9 g/dL (ref 3.5–5.0)
Alkaline Phosphatase: 60 U/L (ref 38–126)
Anion gap: 11 (ref 5–15)
BUN: 15 mg/dL (ref 6–20)
CO2: 24 mmol/L (ref 22–32)
Calcium: 9.1 mg/dL (ref 8.9–10.3)
Chloride: 102 mmol/L (ref 98–111)
Creatinine, Ser: 0.71 mg/dL (ref 0.44–1.00)
GFR, Estimated: >60 mL/min (ref >60)
Glucose, Bld: 109 mg/dL — ABNORMAL HIGH (ref 70–99)
Potassium: 3.8 mmol/L (ref 3.5–5.1)
Sodium: 137 mmol/L (ref 135–145)
Total Bilirubin: <0.2 mg/dL (ref 0.0–1.2)
Total Protein: 6.8 g/dL (ref 6.5–8.1)

## 2024-02-26 LAB — CBC
HCT: 38.9 % (ref 36.0–46.0)
Hemoglobin: 11.9 g/dL — ABNORMAL LOW (ref 12.0–15.0)
MCH: 22 pg — ABNORMAL LOW (ref 26.0–34.0)
MCHC: 30.6 g/dL (ref 30.0–36.0)
MCV: 71.9 fL — ABNORMAL LOW (ref 80.0–100.0)
Platelets: 271 10*3/uL (ref 150–400)
RBC: 5.41 MIL/uL — ABNORMAL HIGH (ref 3.87–5.11)
RDW: 14.5 % (ref 11.5–15.5)
WBC: 6.3 10*3/uL (ref 4.0–10.5)
nRBC: 0 % (ref 0.0–0.2)

## 2024-02-26 LAB — URINALYSIS, ROUTINE W REFLEX MICROSCOPIC
Bacteria, UA: NONE SEEN
Bilirubin Urine: NEGATIVE
Glucose, UA: NEGATIVE mg/dL
Ketones, ur: NEGATIVE mg/dL
Leukocytes,Ua: NEGATIVE
Nitrite: NEGATIVE
Protein, ur: NEGATIVE mg/dL
Specific Gravity, Urine: 1.033 — ABNORMAL HIGH (ref 1.005–1.030)
pH: 5.5 (ref 5.0–8.0)

## 2024-02-26 LAB — LIPASE, BLOOD: Lipase: 26 U/L (ref 11–51)

## 2024-02-26 NOTE — ED Triage Notes (Signed)
 Abd pain-generalized and nausea since last week. Sent from [facility] for concerning distention. Denies CP SOB. Last BM today.

## 2024-03-18 ENCOUNTER — Encounter (HOSPITAL_COMMUNITY): Payer: Self-pay

## 2024-03-18 ENCOUNTER — Emergency Department (HOSPITAL_COMMUNITY)
Admission: EM | Admit: 2024-03-18 | Discharge: 2024-03-18 | Disposition: A | Discharge status: Home / Self Care | Attending: Emergency Medicine | Admitting: Emergency Medicine

## 2024-03-18 ENCOUNTER — Emergency Department (HOSPITAL_COMMUNITY)

## 2024-03-18 DIAGNOSIS — S20212A Contusion of left front wall of thorax, initial encounter: Secondary | ICD-10-CM

## 2024-03-18 DIAGNOSIS — R0781 Pleurodynia: Secondary | ICD-10-CM | POA: Insufficient documentation

## 2024-03-18 DIAGNOSIS — W010XXA Fall on same level from slipping, tripping and stumbling without subsequent striking against object, initial encounter: Secondary | ICD-10-CM | POA: Diagnosis not present

## 2024-03-18 DIAGNOSIS — R0602 Shortness of breath: Secondary | ICD-10-CM | POA: Insufficient documentation

## 2024-03-18 DIAGNOSIS — Y9364 Activity, baseball: Secondary | ICD-10-CM | POA: Insufficient documentation

## 2024-03-18 MED ORDER — LIDOCAINE 5 % EX PTCH
1.0000 | MEDICATED_PATCH | CUTANEOUS | Status: DC
  Administered 2024-03-18: 1 via TRANSDERMAL
  Filled 2024-03-18: qty 1

## 2024-03-18 MED ORDER — IBUPROFEN 800 MG PO TABS
800.0000 mg | ORAL_TABLET | Freq: Once | ORAL | Status: AC
  Administered 2024-03-18: 800 mg via ORAL
  Filled 2024-03-18: qty 1

## 2024-03-18 MED ORDER — CELECOXIB 200 MG PO CAPS: 200.0000 mg | ORAL_CAPSULE | Freq: Two times a day (BID) | ORAL | 0 refills | Status: AC

## 2024-03-18 NOTE — ED Triage Notes (Signed)
 Pt states that she was playing softball and fell onto her left side, complaining of pain in her L rib cage and having SOB, worse with deep breathing.

## 2024-03-18 NOTE — ED Provider Notes (Signed)
 [place] EMERGENCY DEPARTMENT AT [facility] Provider Note   CSN: [id number] Arrival date & time: 03/18/24  1935     History  Chief Complaint  Patient presents with   Coleridge Davenport    Renee Avery is a 48 y.o. female who presents emergency department chief complaint of left rib cage injury.  Patient was playing softball without cleats went for a ball in the outfield missed the ball slipped and then fell directly onto her right rib cage.  Patient states that since that time she has had pain with deep inhalation.   She denies loss of consciousness or other injury.   Fall       Home Medications Prior to Admission medications   Medication Sig Start Date End Date Taking? Authorizing Provider  Cholecalciferol (VITAMIN D3) 125 MCG (5000 UT) CAPS Take 5,000 Units by mouth daily.    [provider]  diphenhydrAMINE  (BENADRYL ) 25 MG tablet Take 25 mg by mouth daily as needed for allergies.    [provider]  EPINEPHrine  0.3 mg/0.3 mL IJ SOAJ injection Inject 0.3 mg into the muscle as needed for anaphylaxis. 03/27/23   Ninetta Basket, MD  ferrous sulfate 325 (65 FE) MG EC tablet Take 325 mg by mouth daily.     [provider]  hydrochlorothiazide  (HYDRODIURIL ) 12.5 MG tablet Take 12.5 mg by mouth daily. 03/03/22   [provider]  ibuprofen  (ADVIL ) 200 MG tablet Take 800 mg by mouth every 6 (six) hours as needed.    [provider]  JUNEL FE 1/20 1-20 MG-MCG tablet Take 1 tablet by mouth daily. 11/17/22   [provider]  Multiple Vitamin (MULTIVITAMIN) capsule Take 1 capsule by mouth daily.    [provider]  oxyCODONE  (OXY IR/ROXICODONE ) 5 MG immediate release tablet Take 1 tablet (5 mg total) by mouth every 6 (six) hours as needed for severe pain (pain score 7-10). 09/28/23   Sim Dryer, MD      Allergies    Other    Review of Systems   Review of Systems  Physical Exam Updated Vital Signs BP 132/78   Pulse  86   Temp 98.3 F (36.8 C)   Ht 5' 2 (1.575 m)   Wt 94.8 kg   SpO2 97%   BMI 38.23 kg/m  Physical Exam Vitals and nursing note reviewed.  Constitutional:      General: She is not in acute distress.    Appearance: She is well-developed. She is not diaphoretic.  HENT:     Head: Normocephalic and atraumatic.     Right Ear: External ear normal.     Left Ear: External ear normal.     Nose: Nose normal.     Mouth/Throat:     Mouth: Mucous membranes are moist.  Eyes:     General: No scleral icterus.    Conjunctiva/sclera: Conjunctivae normal.  Cardiovascular:     Rate and Rhythm: Normal rate and regular rhythm.     Heart sounds: Normal heart sounds. No murmur heard.    No friction rub. No gallop.  Pulmonary:     Effort: Pulmonary effort is normal. No respiratory distress.     Breath sounds: Normal breath sounds.  Chest:     Comments: No obvious deformities crepitus or point tenderness over the lateral rib cage.  No clinical findings of fracture.  No bruising Abdominal:     General: Bowel sounds are normal. There is no distension.     Palpations:  Abdomen is soft. There is no mass.     Tenderness: There is no abdominal tenderness. There is no guarding.  Musculoskeletal:     Cervical back: Normal range of motion.  Skin:    General: Skin is warm and dry.  Neurological:     Mental Status: She is alert and oriented to person, place, and time.  Psychiatric:        Behavior: Behavior normal.     ED Results / Procedures / Treatments   Labs (all labs ordered are listed, but only abnormal results are displayed) Labs Reviewed - No data to display  EKG None  Radiology DG Ribs Unilateral W/Chest Left Result Date: 03/18/2024 CLINICAL DATA:  Recent fall with chest pain, initial encounter EXAM: LEFT RIBS AND CHEST - 3+ VIEW COMPARISON:  04/27/2023 FINDINGS: No fracture or other bone lesions are seen involving the ribs. There is no evidence of pneumothorax or pleural effusion. Both  lungs are clear. Heart size and mediastinal contours are within normal limits. IMPRESSION: No acute abnormality noted Electronically Signed   By: Violeta Grey M.D.   On: 03/18/2024 20:27    Procedures Procedures    Medications Ordered in ED Medications  lidocaine  (LIDODERM ) 5 % 1 patch (has no administration in time range)  ibuprofen  (ADVIL ) tablet 800 mg (has no administration in time range)    ED Course/ Medical Decision Making/ A&P                                 Medical Decision Making Amount and/or Complexity of Data Reviewed Radiology: ordered.  Risk Prescription drug management.   Patient here with left rib injury.  Normal respiratory rate and respiratory pattern without guarding.  Clinically she does not appear to have a rib fracture at this time.  Patient given ibuprofen  and a Lidoderm  patch here in the emergency department.  She will be discharged with pain control, instructions for deep breathing and inhalation.  Discussed outpatient follow-up and return precautions.        Final Clinical Impression(s) / ED Diagnoses Final diagnoses:  None    Rx / DC Orders ED Discharge Orders     None         Tama Fails, PA-C 03/18/24 2045    Scarlette Currier, MD 03/22/24 2307

## 2024-03-18 NOTE — Discharge Instructions (Addendum)
 Your xray showed no rib fracture  I am discharging you with an rx for pain control  Also ice the area.   Contact a health care provider if you have: Increased bruising or swelling. Pain that is not controlled with treatment. A fever. Get help right away if you: Have difficulty breathing or shortness of breath. Develop a continual cough, or you cough up thick or bloody mucus from your lungs (sputum). Feel nauseous or you vomit. Have pain in your abdomen. These symptoms may represent a serious problem that is an emergency. Do not wait to see if the symptoms will go away. Get medical help right away. Call your local emergency services (911 in the [place] Do not drive yourself to the hospital.

## 2024-07-12 ENCOUNTER — Encounter: Payer: Self-pay | Admitting: Podiatry

## 2024-07-12 ENCOUNTER — Ambulatory Visit (INDEPENDENT_AMBULATORY_CARE_PROVIDER_SITE_OTHER)

## 2024-07-12 ENCOUNTER — Ambulatory Visit: Admitting: Podiatry

## 2024-07-12 ENCOUNTER — Ambulatory Visit: Payer: Self-pay | Admitting: Podiatry

## 2024-07-12 VITALS — Ht 62.0 in | Wt 209.0 lb

## 2024-07-12 DIAGNOSIS — M779 Enthesopathy, unspecified: Secondary | ICD-10-CM | POA: Diagnosis not present

## 2024-07-12 DIAGNOSIS — M21619 Bunion of unspecified foot: Secondary | ICD-10-CM

## 2024-07-12 DIAGNOSIS — M778 Other enthesopathies, not elsewhere classified: Secondary | ICD-10-CM | POA: Diagnosis not present

## 2024-07-12 MED ORDER — DICLOFENAC SODIUM 75 MG PO TBEC: 75.0000 mg | DELAYED_RELEASE_TABLET | Freq: Two times a day (BID) | ORAL | 2 refills | Status: AC

## 2024-07-12 NOTE — Progress Notes (Signed)
 Subjective:   Patient ID: Renee Avery, female   DOB: 48 y.o.   MRN: [id number]   HPI Patient feels like her arch is falling right or left and she does have bunion deformity right over left foot and states that it is been going on for about a year and gradually getting worse.  States the pain seems to occur more with gait and patient does not smoke currently tries to be active   Review of Systems  All other systems reviewed and are negative.       Objective:  Physical Exam Vitals and nursing note reviewed.  Constitutional:      Appearance: She is well-developed.  Pulmonary:     Effort: Pulmonary effort is normal.  Musculoskeletal:        General: Normal range of motion.  Skin:    General: Skin is warm.  Neurological:     Mental Status: She is alert.     Neurovascular status intact significant drop of the medial longitudinal arch bilateral with mid arch inflammation noted with patient having some slight forefoot pain also moderate bunion deformity right over left with redness and irritation with certain shoes.  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  Patient will be seen back to recheck and all questions answered may require more aggressive treatment depending on response  X-rays indicate moderate bunion deformity right with redness noted around the joint and collapse medial longitudinal arch foot moderate intensity

## 2024-09-20 ENCOUNTER — Telehealth: Payer: Self-pay

## 2024-09-20 NOTE — Telephone Encounter (Signed)
 Called the patient to try and schedule an appointment to come in and PUO. Unable to reach and unable to LVM, Will send a mychart message to notify.
